# Patient Record
Sex: Female | Born: 1990 | Race: Asian | Hispanic: No | Marital: Married | Smoking: Never smoker
Health system: Southern US, Community
[De-identification: ages and names within clinical notes are randomized; demographics above are authoritative.]

## PROBLEM LIST (undated history)

## (undated) DIAGNOSIS — Z789 Other specified health status: Secondary | ICD-10-CM

## (undated) HISTORY — PX: NO PAST SURGERIES: SHX2092

## (undated) HISTORY — DX: Other specified health status: Z78.9

---

## 2013-02-07 ENCOUNTER — Ambulatory Visit (INDEPENDENT_AMBULATORY_CARE_PROVIDER_SITE_OTHER): Payer: Self-pay | Admitting: *Deleted

## 2013-02-07 DIAGNOSIS — Z3201 Encounter for pregnancy test, result positive: Secondary | ICD-10-CM

## 2013-02-07 LAB — POCT PREGNANCY, URINE: Preg Test, Ur: POSITIVE — AB

## 2013-03-16 ENCOUNTER — Encounter: Payer: Self-pay | Admitting: Family Medicine

## 2013-03-16 ENCOUNTER — Ambulatory Visit (INDEPENDENT_AMBULATORY_CARE_PROVIDER_SITE_OTHER): Payer: Medicaid Other | Admitting: Family Medicine

## 2013-03-16 VITALS — BP 96/61 | Temp 97.6°F | Ht 60.5 in | Wt 108.0 lb

## 2013-03-16 DIAGNOSIS — O0930 Supervision of pregnancy with insufficient antenatal care, unspecified trimester: Secondary | ICD-10-CM

## 2013-03-16 DIAGNOSIS — Z3402 Encounter for supervision of normal first pregnancy, second trimester: Secondary | ICD-10-CM

## 2013-03-16 LAB — POCT URINALYSIS DIP (DEVICE)
Ketones, ur: NEGATIVE mg/dL
Protein, ur: NEGATIVE mg/dL
Specific Gravity, Urine: 1.015 (ref 1.005–1.030)
Urobilinogen, UA: 0.2 mg/dL (ref 0.0–1.0)
pH: 7.5 (ref 5.0–8.0)

## 2013-03-16 MED ORDER — PRENATAL VITAMINS 0.8 MG PO TABS: 1.0000 | ORAL_TABLET | Freq: Every day | ORAL | Status: DC

## 2013-03-16 NOTE — Progress Notes (Signed)
Married to baby's father. Plans to breast and bottle.   Subjective:    Lori Cardenas is being seen today for her first obstetrical visit.  This is not a planned pregnancy. She is at [redacted]w[redacted]d gestation by LMP (pretty sure). Her obstetrical history is significant for nothing. Relationship with FOB: spouse, living together. Patient does intend to breast and bottle feed. Pregnancy history fully reviewed.  Menstrual History: OB History   Grav Para Term Preterm Abortions TAB SAB Ect Mult Living   1               Patient's last menstrual period was 11/09/2012.   The following portions of the patient's history were reviewed and updated as appropriate: allergies, current medications, past family history, past medical history, past social history, past surgical history and problem list.  Review of Systems Constitutional: negative for chills, fevers and night sweats Eyes: negative for visual disturbance Respiratory: positive for cough, negative for asthma, wheezing Cardiovascular: negative for chest pain, dyspnea, irregular heart beat and palpitations Gastrointestinal: negative for abdominal pain, constipation, diarrhea, nausea and vomiting Genitourinary:negative for dysuria and vaginal discharge Neurological: negative for dizziness and headaches    Objective:   Filed Vitals:   03/16/13 1015  BP: 96/61  Temp: 97.6 F (36.4 C)    GEN:  WNWD, no distress HEENT:  NCAT, EOMI, normal conjunctiva NECK:  Supple, no adenopathy, no thyromegaly CV:  RRR, no murmur RESP:  CTAB ABD:  Soft, gravid (FH appropriate for dates), norma BS, no tenderness PELVIC:  Normal external genitalia and vagina. Normal cervix. Minimal discharge. No CMT or adnexal tenderness EXTREM:  No edema or tenderness NEURO:  Alert, oriented, no focal deficits    Assessment:    Pregnancy at 18 and 1/7 weeks    Plan:    Initial labs drawn. Prenatal vitamins. Problem list reviewed and updated. AFP3 discussed: declined. Role  of ultrasound in pregnancy discussed; fetal survey: requested. Amniocentesis discussed: not indicated. Follow up in 4 weeks. 50% of 45 min visit spent on counseling and coordination of care.

## 2013-03-16 NOTE — Patient Instructions (Addendum)
Pregnancy - Second Trimester The second trimester of pregnancy (3 to 6 months) is a period of rapid growth for you and your baby. At the end of the sixth month, your baby is about 9 inches long and weighs 1 1/2 pounds. You will begin to feel the baby move between 18 and 20 weeks of the pregnancy. This is called quickening. Weight gain is faster. A clear fluid (colostrum) may leak out of your breasts. You may feel small contractions of the womb (uterus). This is known as false labor or Braxton-Hicks contractions. This is like a practice for labor when the baby is ready to be born. Usually, the problems with morning sickness have usually passed by the end of your first trimester. Some women develop small dark blotches (called cholasma, mask of pregnancy) on their face that usually goes away after the baby is born. Exposure to the sun makes the blotches worse. Acne may also develop in some pregnant women and pregnant women who have acne, may find that it goes away. PRENATAL EXAMS  Blood work may continue to be done during prenatal exams. These tests are done to check on your health and the probable health of your baby. Blood work is used to follow your blood levels (hemoglobin). Anemia (low hemoglobin) is common during pregnancy. Iron and vitamins are given to help prevent this. You will also be checked for diabetes between 24 and 28 weeks of the pregnancy. Some of the previous blood tests may be repeated.  The size of the uterus is measured during each visit. This is to make sure that the baby is continuing to grow properly according to the dates of the pregnancy.  Your blood pressure is checked every prenatal visit. This is to make sure you are not getting toxemia.  Your urine is checked to make sure you do not have an infection, diabetes or protein in the urine.  Your weight is checked often to make sure gains are happening at the suggested rate. This is to ensure that both you and your baby are growing  normally.  Sometimes, an ultrasound is performed to confirm the proper growth and development of the baby. This is a test which bounces harmless sound waves off the baby so your caregiver can more accurately determine due dates. Sometimes, a specialized test is done on the amniotic fluid surrounding the baby. This test is called an amniocentesis. The amniotic fluid is obtained by sticking a needle into the belly (abdomen). This is done to check the chromosomes in instances where there is a concern about possible genetic problems with the baby. It is also sometimes done near the end of pregnancy if an early delivery is required. In this case, it is done to help make sure the baby's lungs are mature enough for the baby to live outside of the womb. CHANGES OCCURING IN THE SECOND TRIMESTER OF PREGNANCY Your body goes through many changes during pregnancy. They vary from person to person. Talk to your caregiver about changes you notice that you are concerned about.  During the second trimester, you will likely have an increase in your appetite. It is normal to have cravings for certain foods. This varies from person to person and pregnancy to pregnancy.  Your lower abdomen will begin to bulge.  You may have to urinate more often because the uterus and baby are pressing on your bladder. It is also common to get more bladder infections during pregnancy (pain with urination). You can help this by   drinking lots of fluids and emptying your bladder before and after intercourse.  You may begin to get stretch marks on your hips, abdomen, and breasts. These are normal changes in the body during pregnancy. There are no exercises or medications to take that prevent this change.  You may begin to develop swollen and bulging veins (varicose veins) in your legs. Wearing support hose, elevating your feet for 15 minutes, 3 to 4 times a day and limiting salt in your diet helps lessen the problem.  Heartburn may develop  as the uterus grows and pushes up against the stomach. Antacids recommended by your caregiver helps with this problem. Also, eating smaller meals 4 to 5 times a day helps.  Constipation can be treated with a stool softener or adding bulk to your diet. Drinking lots of fluids, vegetables, fruits, and whole grains are helpful.  Exercising is also helpful. If you have been very active up until your pregnancy, most of these activities can be continued during your pregnancy. If you have been less active, it is helpful to start an exercise program such as walking.  Hemorrhoids (varicose veins in the rectum) may develop at the end of the second trimester. Warm sitz baths and hemorrhoid cream recommended by your caregiver helps hemorrhoid problems.  Backaches may develop during this time of your pregnancy. Avoid heavy lifting, wear low heal shoes and practice good posture to help with backache problems.  Some pregnant women develop tingling and numbness of their hand and fingers because of swelling and tightening of ligaments in the wrist (carpel tunnel syndrome). This goes away after the baby is born.  As your breasts enlarge, you may have to get a bigger bra. Get a comfortable, cotton, support bra. Do not get a nursing bra until the last month of the pregnancy if you will be nursing the baby.  You may get a dark line from your belly button to the pubic area called the linea nigra.  You may develop rosy cheeks because of increase blood flow to the face.  You may develop spider looking lines of the face, neck, arms and chest. These go away after the baby is born. HOME CARE INSTRUCTIONS   It is extremely important to avoid all smoking, herbs, alcohol, and unprescribed drugs during your pregnancy. These chemicals affect the formation and growth of the baby. Avoid these chemicals throughout the pregnancy to ensure the delivery of a healthy infant.  Most of your home care instructions are the same as  suggested for the first trimester of your pregnancy. Keep your caregiver's appointments. Follow your caregiver's instructions regarding medication use, exercise and diet.  During pregnancy, you are providing food for you and your baby. Continue to eat regular, well-balanced meals. Choose foods such as meat, fish, milk and other low fat dairy products, vegetables, fruits, and whole-grain breads and cereals. Your caregiver will tell you of the ideal weight gain.  A physical sexual relationship may be continued up until near the end of pregnancy if there are no other problems. Problems could include early (premature) leaking of amniotic fluid from the membranes, vaginal bleeding, abdominal pain, or other medical or pregnancy problems.  Exercise regularly if there are no restrictions. Check with your caregiver if you are unsure of the safety of some of your exercises. The greatest weight gain will occur in the last 2 trimesters of pregnancy. Exercise will help you:  Control your weight.  Get you in shape for labor and delivery.  Lose weight   after you have the baby.  Wear a good support or jogging bra for breast tenderness during pregnancy. This may help if worn during sleep. Pads or tissues may be used in the bra if you are leaking colostrum.  Do not use hot tubs, steam rooms or saunas throughout the pregnancy.  Wear your seat belt at all times when driving. This protects you and your baby if you are in an accident.  Avoid raw meat, uncooked cheese, cat litter boxes and soil used by cats. These carry germs that can cause birth defects in the baby.  The second trimester is also a good time to visit your dentist for your dental health if this has not been done yet. Getting your teeth cleaned is OK. Use a soft toothbrush. Brush gently during pregnancy.  It is easier to loose urine during pregnancy. Tightening up and strengthening the pelvic muscles will help with this problem. Practice stopping your  urination while you are going to the bathroom. These are the same muscles you need to strengthen. It is also the muscles you would use as if you were trying to stop from passing gas. You can practice tightening these muscles up 10 times a set and repeating this about 3 times per day. Once you know what muscles to tighten up, do not perform these exercises during urination. It is more likely to contribute to an infection by backing up the urine.  Ask for help if you have financial, counseling or nutritional needs during pregnancy. Your caregiver will be able to offer counseling for these needs as well as refer you for other special needs.  Your skin may become oily. If so, wash your face with mild soap, use non-greasy moisturizer and oil or cream based makeup. MEDICATIONS AND DRUG USE IN PREGNANCY  Take prenatal vitamins as directed. The vitamin should contain 1 milligram of folic acid. Keep all vitamins out of reach of children. Only a couple vitamins or tablets containing iron may be fatal to a baby or young child when ingested.  Avoid use of all medications, including herbs, over-the-counter medications, not prescribed or suggested by your caregiver. Only take over-the-counter or prescription medicines for pain, discomfort, or fever as directed by your caregiver. Do not use aspirin.  Let your caregiver also know about herbs you may be using.  Alcohol is related to a number of birth defects. This includes fetal alcohol syndrome. All alcohol, in any form, should be avoided completely. Smoking will cause low birth rate and premature babies.  Street or illegal drugs are very harmful to the baby. They are absolutely forbidden. A baby born to an addicted mother will be addicted at birth. The baby will go through the same withdrawal an adult does. SEEK MEDICAL CARE IF:  You have any concerns or worries during your pregnancy. It is better to call with your questions if you feel they cannot wait, rather  than worry about them. SEEK IMMEDIATE MEDICAL CARE IF:   An unexplained oral temperature above 102 F (38.9 C) develops, or as your caregiver suggests.  You have leaking of fluid from the vagina (birth canal). If leaking membranes are suspected, take your temperature and tell your caregiver of this when you call.  There is vaginal spotting, bleeding, or passing clots. Tell your caregiver of the amount and how many pads are used. Light spotting in pregnancy is common, especially following intercourse.  You develop a bad smelling vaginal discharge with a change in the color from clear   to white.  You continue to feel sick to your stomach (nauseated) and have no relief from remedies suggested. You vomit blood or coffee ground-like materials.  You lose more than 2 pounds of weight or gain more than 2 pounds of weight over 1 week, or as suggested by your caregiver.  You notice swelling of your face, hands, feet, or legs.  You get exposed to German measles and have never had them.  You are exposed to fifth disease or chickenpox.  You develop belly (abdominal) pain. Round ligament discomfort is a common non-cancerous (benign) cause of abdominal pain in pregnancy. Your caregiver still must evaluate you.  You develop a bad headache that does not go away.  You develop fever, diarrhea, pain with urination, or shortness of breath.  You develop visual problems, blurry, or double vision.  You fall or are in a car accident or any kind of trauma.  There is mental or physical violence at home. Document Released: 11/25/2001 Document Revised: 02/23/2012 Document Reviewed: 05/30/2009 [employer] Patient Information 2013 [employer].  

## 2013-03-16 NOTE — Progress Notes (Signed)
Pulse 87 C/o of lower back pain and h/a.

## 2013-03-21 ENCOUNTER — Other Ambulatory Visit: Payer: Self-pay | Admitting: Family Medicine

## 2013-03-21 ENCOUNTER — Encounter: Payer: Self-pay | Admitting: Family Medicine

## 2013-03-21 ENCOUNTER — Ambulatory Visit (HOSPITAL_COMMUNITY)
Admission: RE | Admit: 2013-03-21 | Discharge: 2013-03-21 | Disposition: A | Discharge status: Home / Self Care | Payer: Medicaid Other | Source: Ambulatory Visit | Attending: Family Medicine | Admitting: Family Medicine

## 2013-03-21 DIAGNOSIS — Z3402 Encounter for supervision of normal first pregnancy, second trimester: Secondary | ICD-10-CM

## 2013-03-21 DIAGNOSIS — Z3689 Encounter for other specified antenatal screening: Secondary | ICD-10-CM | POA: Insufficient documentation

## 2013-03-21 MED ORDER — FLUCONAZOLE 150 MG PO TABS: 150.0000 mg | ORAL_TABLET | Freq: Once | ORAL | Status: DC

## 2013-03-22 ENCOUNTER — Telehealth: Payer: Self-pay | Admitting: *Deleted

## 2013-03-22 DIAGNOSIS — B379 Candidiasis, unspecified: Secondary | ICD-10-CM

## 2013-03-22 MED ORDER — FLUCONAZOLE 150 MG PO TABS: 150.0000 mg | ORAL_TABLET | Freq: Once | ORAL | Status: DC

## 2013-03-22 NOTE — Telephone Encounter (Signed)
Rx sent to pharmacy   

## 2013-03-22 NOTE — Telephone Encounter (Signed)
Left message for patient to call us back.  

## 2013-03-22 NOTE — Telephone Encounter (Signed)
Message copied by Mannie Stabile on Tue Mar 22, 2013 11:24 AM ------      Message from: FERRY, [place]      Created: Mon Mar 21, 2013  5:01 PM       Needs diflucan for yeast. Dating change to due date 07/12/13. ------

## 2013-03-23 NOTE — Telephone Encounter (Signed)
Called patient and informed her of results and that the medicine is available for her to pick up at [facility] on [address].patient verbalized understanding and had no further questions

## 2013-03-30 ENCOUNTER — Encounter: Payer: Self-pay | Admitting: *Deleted

## 2013-04-13 ENCOUNTER — Ambulatory Visit (INDEPENDENT_AMBULATORY_CARE_PROVIDER_SITE_OTHER): Payer: Medicaid Other | Admitting: Family Medicine

## 2013-04-13 VITALS — BP 113/76 | Temp 96.6°F | Wt 109.3 lb

## 2013-04-13 DIAGNOSIS — Z23 Encounter for immunization: Secondary | ICD-10-CM

## 2013-04-13 DIAGNOSIS — O0930 Supervision of pregnancy with insufficient antenatal care, unspecified trimester: Secondary | ICD-10-CM

## 2013-04-13 LAB — POCT URINALYSIS DIP (DEVICE)
Bilirubin Urine: NEGATIVE
Ketones, ur: NEGATIVE mg/dL
Leukocytes, UA: NEGATIVE
Protein, ur: NEGATIVE mg/dL
Specific Gravity, Urine: 1.02 (ref 1.005–1.030)

## 2013-04-13 LAB — CBC
HCT: 33.6 % — ABNORMAL LOW (ref 36.0–46.0)
Hemoglobin: 11.7 g/dL — ABNORMAL LOW (ref 12.0–15.0)
MCHC: 34.8 g/dL (ref 30.0–36.0)
RBC: 3.89 MIL/uL (ref 3.87–5.11)
WBC: 9.1 10*3/uL (ref 4.0–10.5)

## 2013-04-13 MED ORDER — TETANUS-DIPHTH-ACELL PERTUSSIS 5-2.5-18.5 LF-MCG/0.5 IM SUSP
0.5000 mL | Freq: Once | INTRAMUSCULAR | Status: AC
  Administered 2013-04-13: 0.5 mL via INTRAMUSCULAR

## 2013-04-13 NOTE — Progress Notes (Signed)
No concerns. 1 hour GTT today.

## 2013-04-13 NOTE — Patient Instructions (Addendum)
Pregnancy - Third Trimester  The third trimester of pregnancy (the last 3 months) is a period of the most rapid growth for you and your baby. The baby approaches a length of 20 inches and a weight of 6 to 10 pounds. The baby is adding on fat and getting ready for life outside your body. While inside, babies have periods of sleeping and waking, suck their thumbs, and hiccups. You can often feel small contractions of the uterus. This is false labor. It is also called Braxton-Hicks contractions. This is like a practice for labor. The usual problems in this stage of pregnancy include more difficulty breathing, swelling of the hands and feet from water retention, and having to urinate more often because of the uterus and baby pressing on your bladder.   PRENATAL EXAMS  · Blood work may continue to be done during prenatal exams. These tests are done to check on your health and the probable health of your baby. Blood work is used to follow your blood levels (hemoglobin). Anemia (low hemoglobin) is common during pregnancy. Iron and vitamins are given to help prevent this. You may also continue to be checked for diabetes. Some of the past blood tests may be done again.  · The size of the uterus is measured during each visit. This makes sure your baby is growing properly according to your pregnancy dates.  · Your blood pressure is checked every prenatal visit. This is to make sure you are not getting toxemia.  · Your urine is checked every prenatal visit for infection, diabetes and protein.  · Your weight is checked at each visit. This is done to make sure gains are happening at the suggested rate and that you and your baby are growing normally.  · Sometimes, an ultrasound is performed to confirm the position and the proper growth and development of the baby. This is a test done that bounces harmless sound waves off the baby so your caregiver can more accurately determine due dates.  · Discuss the type of pain medication and  anesthesia you will have during your labor and delivery.  · Discuss the possibility and anesthesia if a Cesarean Section might be necessary.  · Inform your caregiver if there is any mental or physical violence at home.  Sometimes, a specialized non-stress test, contraction stress test and biophysical profile are done to make sure the baby is not having a problem. Checking the amniotic fluid surrounding the baby is called an amniocentesis. The amniotic fluid is removed by sticking a needle into the belly (abdomen). This is sometimes done near the end of pregnancy if an early delivery is required. In this case, it is done to help make sure the baby's lungs are mature enough for the baby to live outside of the womb. If the lungs are not mature and it is unsafe to deliver the baby, an injection of cortisone medication is given to the mother 1 to 2 days before the delivery. This helps the baby's lungs mature and makes it safer to deliver the baby.  CHANGES OCCURING IN THE THIRD TRIMESTER OF PREGNANCY  Your body goes through many changes during pregnancy. They vary from person to person. Talk to your caregiver about changes you notice and are concerned about.  · During the last trimester, you have probably had an increase in your appetite. It is normal to have cravings for certain foods. This varies from person to person and pregnancy to pregnancy.  · You may begin to   get stretch marks on your hips, abdomen, and breasts. These are normal changes in the body during pregnancy. There are no exercises or medications to take which prevent this change.  · Constipation may be treated with a stool softener or adding bulk to your diet. Drinking lots of fluids, fiber in vegetables, fruits, and whole grains are helpful.  · Exercising is also helpful. If you have been very active up until your pregnancy, most of these activities can be continued during your pregnancy. If you have been less active, it is helpful to start an exercise  program such as walking. Consult your caregiver before starting exercise programs.  · Avoid all smoking, alcohol, un-prescribed drugs, herbs and "street drugs" during your pregnancy. These chemicals affect the formation and growth of the baby. Avoid chemicals throughout the pregnancy to ensure the delivery of a healthy infant.  · Backache, varicose veins and hemorrhoids may develop or get worse.  · You will tire more easily in the third trimester, which is normal.  · The baby's movements may be stronger and more often.  · You may become short of breath easily.  · Your belly button may stick out.  · A yellow discharge may leak from your breasts called colostrum.  · You may have a bloody mucus discharge. This usually occurs a few days to a week before labor begins.  HOME CARE INSTRUCTIONS   · Keep your caregiver's appointments. Follow your caregiver's instructions regarding medication use, exercise, and diet.  · During pregnancy, you are providing food for you and your baby. Continue to eat regular, well-balanced meals. Choose foods such as meat, fish, milk and other low fat dairy products, vegetables, fruits, and whole-grain breads and cereals. Your caregiver will tell you of the ideal weight gain.  · A physical sexual relationship may be continued throughout pregnancy if there are no other problems such as early (premature) leaking of amniotic fluid from the membranes, vaginal bleeding, or belly (abdominal) pain.  · Exercise regularly if there are no restrictions. Check with your caregiver if you are unsure of the safety of your exercises. Greater weight gain will occur in the last 2 trimesters of pregnancy. Exercising helps:  · Control your weight.  · Get you in shape for labor and delivery.  · You lose weight after you deliver.  · Rest a lot with legs elevated, or as needed for leg cramps or low back pain.  · Wear a good support or jogging bra for breast tenderness during pregnancy. This may help if worn during  sleep. Pads or tissues may be used in the bra if you are leaking colostrum.  · Do not use hot tubs, steam rooms, or saunas.  · Wear your seat belt when driving. This protects you and your baby if you are in an accident.  · Avoid raw meat, cat litter boxes and soil used by cats. These carry germs that can cause birth defects in the baby.  · It is easier to loose urine during pregnancy. Tightening up and strengthening the pelvic muscles will help with this problem. You can practice stopping your urination while you are going to the bathroom. These are the same muscles you need to strengthen. It is also the muscles you would use if you were trying to stop from passing gas. You can practice tightening these muscles up 10 times a set and repeating this about 3 times per day. Once you know what muscles to tighten up, do not perform these   exercises during urination. It is more likely to cause an infection by backing up the urine.  · Ask for help if you have financial, counseling or nutritional needs during pregnancy. Your caregiver will be able to offer counseling for these needs as well as refer you for other special needs.  · Make a list of emergency phone numbers and have them available.  · Plan on getting help from family or friends when you go home from the hospital.  · Make a trial run to the hospital.  · Take prenatal classes with the father to understand, practice and ask questions about the labor and delivery.  · Prepare the baby's room/nursery.  · Do not travel out of the city unless it is absolutely necessary and with the advice of your caregiver.  · Wear only low or no heal shoes to have better balance and prevent falling.  MEDICATIONS AND DRUG USE IN PREGNANCY  · Take prenatal vitamins as directed. The vitamin should contain 1 milligram of folic acid. Keep all vitamins out of reach of children. Only a couple vitamins or tablets containing iron may be fatal to a baby or young child when ingested.  · Avoid use  of all medications, including herbs, over-the-counter medications, not prescribed or suggested by your caregiver. Only take over-the-counter or prescription medicines for pain, discomfort, or fever as directed by your caregiver. Do not use aspirin, ibuprofen (Motrin®, Advil®, Nuprin®) or naproxen (Aleve®) unless OK'd by your caregiver.  · Let your caregiver also know about herbs you may be using.  · Alcohol is related to a number of birth defects. This includes fetal alcohol syndrome. All alcohol, in any form, should be avoided completely. Smoking will cause low birth rate and premature babies.  · Street/illegal drugs are very harmful to the baby. They are absolutely forbidden. A baby born to an addicted mother will be addicted at birth. The baby will go through the same withdrawal an adult does.  SEEK MEDICAL CARE IF:  You have any concerns or worries during your pregnancy. It is better to call with your questions if you feel they cannot wait, rather than worry about them.  DECISIONS ABOUT CIRCUMCISION  You may or may not know the sex of your baby. If you know your baby is a boy, it may be time to think about circumcision. Circumcision is the removal of the foreskin of the penis. This is the skin that covers the sensitive end of the penis. There is no proven medical need for this. Often this decision is made on what is popular at the time or based upon religious beliefs and social issues. You can discuss these issues with your caregiver or pediatrician.  SEEK IMMEDIATE MEDICAL CARE IF:   · An unexplained oral temperature above 102° F (38.9° C) develops, or as your caregiver suggests.  · You have leaking of fluid from the vagina (birth canal). If leaking membranes are suspected, take your temperature and tell your caregiver of this when you call.  · There is vaginal spotting, bleeding or passing clots. Tell your caregiver of the amount and how many pads are used.  · You develop a bad smelling vaginal discharge with  a change in the color from clear to white.  · You develop vomiting that lasts more than 24 hours.  · You develop chills or fever.  · You develop shortness of breath.  · You develop burning on urination.  · You loose more than 2 pounds of weight   or gain more than 2 pounds of weight or as suggested by your caregiver.  · You notice sudden swelling of your face, hands, and feet or legs.  · You develop belly (abdominal) pain. Round ligament discomfort is a common non-cancerous (benign) cause of abdominal pain in pregnancy. Your caregiver still must evaluate you.  · You develop a severe headache that does not go away.  · You develop visual problems, blurred or double vision.  · If you have not felt your baby move for more than 1 hour. If you think the baby is not moving as much as usual, eat something with sugar in it and lie down on your left side for an hour. The baby should move at least 4 to 5 times per hour. Call right away if your baby moves less than that.  · You fall, are in a car accident or any kind of trauma.  · There is mental or physical violence at home.  Document Released: 11/25/2001 Document Revised: 02/23/2012 Document Reviewed: 05/30/2009  [employer]® Patient Information ©2013 [employer].

## 2013-04-13 NOTE — Progress Notes (Signed)
Pulse- 80 

## 2013-04-13 NOTE — Addendum Note (Signed)
Addended by: Faythe Casa on: 04/13/2013 12:58 PM   Modules accepted: Orders

## 2013-04-14 LAB — RPR

## 2013-04-14 LAB — HIV ANTIBODY (ROUTINE TESTING W REFLEX): HIV: NONREACTIVE

## 2013-04-14 LAB — GLUCOSE TOLERANCE, 1 HOUR (50G) W/O FASTING: Glucose, 1 Hour GTT: 58 mg/dL — ABNORMAL LOW (ref 70–140)

## 2013-04-17 ENCOUNTER — Encounter: Payer: Self-pay | Admitting: Family Medicine

## 2013-04-27 ENCOUNTER — Ambulatory Visit (INDEPENDENT_AMBULATORY_CARE_PROVIDER_SITE_OTHER): Payer: Medicaid Other | Admitting: Obstetrics and Gynecology

## 2013-04-27 VITALS — BP 106/66 | Temp 96.8°F | Wt 112.5 lb

## 2013-04-27 DIAGNOSIS — O0930 Supervision of pregnancy with insufficient antenatal care, unspecified trimester: Secondary | ICD-10-CM

## 2013-04-27 DIAGNOSIS — Z3403 Encounter for supervision of normal first pregnancy, third trimester: Secondary | ICD-10-CM

## 2013-04-27 LAB — POCT URINALYSIS DIP (DEVICE)
Bilirubin Urine: NEGATIVE
Glucose, UA: NEGATIVE mg/dL
Ketones, ur: NEGATIVE mg/dL
Protein, ur: NEGATIVE mg/dL
Specific Gravity, Urine: 1.02 (ref 1.005–1.030)

## 2013-04-27 NOTE — Progress Notes (Signed)
Doing well. Generalized discomfort only at night> reassured. Reviewed normal labs.

## 2013-04-27 NOTE — Progress Notes (Signed)
Pulse- 89 Patient reports pain all over at night when she lays down

## 2013-04-27 NOTE — Patient Instructions (Signed)
Pregnancy - Third Trimester  The third trimester of pregnancy (the last 3 months) is a period of the most rapid growth for you and your baby. The baby approaches a length of 20 inches and a weight of 6 to 10 pounds. The baby is adding on fat and getting ready for life outside your body. While inside, babies have periods of sleeping and waking, suck their thumbs, and hiccups. You can often feel small contractions of the uterus. This is false labor. It is also called Braxton-Hicks contractions. This is like a practice for labor. The usual problems in this stage of pregnancy include more difficulty breathing, swelling of the hands and feet from water retention, and having to urinate more often because of the uterus and baby pressing on your bladder.   PRENATAL EXAMS  · Blood work may continue to be done during prenatal exams. These tests are done to check on your health and the probable health of your baby. Blood work is used to follow your blood levels (hemoglobin). Anemia (low hemoglobin) is common during pregnancy. Iron and vitamins are given to help prevent this. You may also continue to be checked for diabetes. Some of the past blood tests may be done again.  · The size of the uterus is measured during each visit. This makes sure your baby is growing properly according to your pregnancy dates.  · Your blood pressure is checked every prenatal visit. This is to make sure you are not getting toxemia.  · Your urine is checked every prenatal visit for infection, diabetes and protein.  · Your weight is checked at each visit. This is done to make sure gains are happening at the suggested rate and that you and your baby are growing normally.  · Sometimes, an ultrasound is performed to confirm the position and the proper growth and development of the baby. This is a test done that bounces harmless sound waves off the baby so your caregiver can more accurately determine due dates.  · Discuss the type of pain medication and  anesthesia you will have during your labor and delivery.  · Discuss the possibility and anesthesia if a Cesarean Section might be necessary.  · Inform your caregiver if there is any mental or physical violence at home.  Sometimes, a specialized non-stress test, contraction stress test and biophysical profile are done to make sure the baby is not having a problem. Checking the amniotic fluid surrounding the baby is called an amniocentesis. The amniotic fluid is removed by sticking a needle into the belly (abdomen). This is sometimes done near the end of pregnancy if an early delivery is required. In this case, it is done to help make sure the baby's lungs are mature enough for the baby to live outside of the womb. If the lungs are not mature and it is unsafe to deliver the baby, an injection of cortisone medication is given to the mother 1 to 2 days before the delivery. This helps the baby's lungs mature and makes it safer to deliver the baby.  CHANGES OCCURING IN THE THIRD TRIMESTER OF PREGNANCY  Your body goes through many changes during pregnancy. They vary from person to person. Talk to your caregiver about changes you notice and are concerned about.  · During the last trimester, you have probably had an increase in your appetite. It is normal to have cravings for certain foods. This varies from person to person and pregnancy to pregnancy.  · You may begin to   get stretch marks on your hips, abdomen, and breasts. These are normal changes in the body during pregnancy. There are no exercises or medications to take which prevent this change.  · Constipation may be treated with a stool softener or adding bulk to your diet. Drinking lots of fluids, fiber in vegetables, fruits, and whole grains are helpful.  · Exercising is also helpful. If you have been very active up until your pregnancy, most of these activities can be continued during your pregnancy. If you have been less active, it is helpful to start an exercise  program such as walking. Consult your caregiver before starting exercise programs.  · Avoid all smoking, alcohol, un-prescribed drugs, herbs and "street drugs" during your pregnancy. These chemicals affect the formation and growth of the baby. Avoid chemicals throughout the pregnancy to ensure the delivery of a healthy infant.  · Backache, varicose veins and hemorrhoids may develop or get worse.  · You will tire more easily in the third trimester, which is normal.  · The baby's movements may be stronger and more often.  · You may become short of breath easily.  · Your belly button may stick out.  · A yellow discharge may leak from your breasts called colostrum.  · You may have a bloody mucus discharge. This usually occurs a few days to a week before labor begins.  HOME CARE INSTRUCTIONS   · Keep your caregiver's appointments. Follow your caregiver's instructions regarding medication use, exercise, and diet.  · During pregnancy, you are providing food for you and your baby. Continue to eat regular, well-balanced meals. Choose foods such as meat, fish, milk and other low fat dairy products, vegetables, fruits, and whole-grain breads and cereals. Your caregiver will tell you of the ideal weight gain.  · A physical sexual relationship may be continued throughout pregnancy if there are no other problems such as early (premature) leaking of amniotic fluid from the membranes, vaginal bleeding, or belly (abdominal) pain.  · Exercise regularly if there are no restrictions. Check with your caregiver if you are unsure of the safety of your exercises. Greater weight gain will occur in the last 2 trimesters of pregnancy. Exercising helps:  · Control your weight.  · Get you in shape for labor and delivery.  · You lose weight after you deliver.  · Rest a lot with legs elevated, or as needed for leg cramps or low back pain.  · Wear a good support or jogging bra for breast tenderness during pregnancy. This may help if worn during  sleep. Pads or tissues may be used in the bra if you are leaking colostrum.  · Do not use hot tubs, steam rooms, or saunas.  · Wear your seat belt when driving. This protects you and your baby if you are in an accident.  · Avoid raw meat, cat litter boxes and soil used by cats. These carry germs that can cause birth defects in the baby.  · It is easier to loose urine during pregnancy. Tightening up and strengthening the pelvic muscles will help with this problem. You can practice stopping your urination while you are going to the bathroom. These are the same muscles you need to strengthen. It is also the muscles you would use if you were trying to stop from passing gas. You can practice tightening these muscles up 10 times a set and repeating this about 3 times per day. Once you know what muscles to tighten up, do not perform these   exercises during urination. It is more likely to cause an infection by backing up the urine.  · Ask for help if you have financial, counseling or nutritional needs during pregnancy. Your caregiver will be able to offer counseling for these needs as well as refer you for other special needs.  · Make a list of emergency phone numbers and have them available.  · Plan on getting help from family or friends when you go home from the hospital.  · Make a trial run to the hospital.  · Take prenatal classes with the father to understand, practice and ask questions about the labor and delivery.  · Prepare the baby's room/nursery.  · Do not travel out of the city unless it is absolutely necessary and with the advice of your caregiver.  · Wear only low or no heal shoes to have better balance and prevent falling.  MEDICATIONS AND DRUG USE IN PREGNANCY  · Take prenatal vitamins as directed. The vitamin should contain 1 milligram of folic acid. Keep all vitamins out of reach of children. Only a couple vitamins or tablets containing iron may be fatal to a baby or young child when ingested.  · Avoid use  of all medications, including herbs, over-the-counter medications, not prescribed or suggested by your caregiver. Only take over-the-counter or prescription medicines for pain, discomfort, or fever as directed by your caregiver. Do not use aspirin, ibuprofen (Motrin®, Advil®, Nuprin®) or naproxen (Aleve®) unless OK'd by your caregiver.  · Let your caregiver also know about herbs you may be using.  · Alcohol is related to a number of birth defects. This includes fetal alcohol syndrome. All alcohol, in any form, should be avoided completely. Smoking will cause low birth rate and premature babies.  · Street/illegal drugs are very harmful to the baby. They are absolutely forbidden. A baby born to an addicted mother will be addicted at birth. The baby will go through the same withdrawal an adult does.  SEEK MEDICAL CARE IF:  You have any concerns or worries during your pregnancy. It is better to call with your questions if you feel they cannot wait, rather than worry about them.  DECISIONS ABOUT CIRCUMCISION  You may or may not know the sex of your baby. If you know your baby is a boy, it may be time to think about circumcision. Circumcision is the removal of the foreskin of the penis. This is the skin that covers the sensitive end of the penis. There is no proven medical need for this. Often this decision is made on what is popular at the time or based upon religious beliefs and social issues. You can discuss these issues with your caregiver or pediatrician.  SEEK IMMEDIATE MEDICAL CARE IF:   · An unexplained oral temperature above 102° F (38.9° C) develops, or as your caregiver suggests.  · You have leaking of fluid from the vagina (birth canal). If leaking membranes are suspected, take your temperature and tell your caregiver of this when you call.  · There is vaginal spotting, bleeding or passing clots. Tell your caregiver of the amount and how many pads are used.  · You develop a bad smelling vaginal discharge with  a change in the color from clear to white.  · You develop vomiting that lasts more than 24 hours.  · You develop chills or fever.  · You develop shortness of breath.  · You develop burning on urination.  · You loose more than 2 pounds of weight   or gain more than 2 pounds of weight or as suggested by your caregiver.  · You notice sudden swelling of your face, hands, and feet or legs.  · You develop belly (abdominal) pain. Round ligament discomfort is a common non-cancerous (benign) cause of abdominal pain in pregnancy. Your caregiver still must evaluate you.  · You develop a severe headache that does not go away.  · You develop visual problems, blurred or double vision.  · If you have not felt your baby move for more than 1 hour. If you think the baby is not moving as much as usual, eat something with sugar in it and lie down on your left side for an hour. The baby should move at least 4 to 5 times per hour. Call right away if your baby moves less than that.  · You fall, are in a car accident or any kind of trauma.  · There is mental or physical violence at home.  Document Released: 11/25/2001 Document Revised: 02/23/2012 Document Reviewed: 05/30/2009  [employer]® Patient Information ©2013 [employer].

## 2013-05-11 ENCOUNTER — Encounter: Payer: Self-pay | Admitting: Family Medicine

## 2013-05-11 ENCOUNTER — Encounter: Payer: Medicaid Other | Admitting: Obstetrics and Gynecology

## 2013-08-16 ENCOUNTER — Encounter: Payer: Self-pay | Admitting: *Deleted

## 2013-08-17 ENCOUNTER — Encounter: Payer: Self-pay | Admitting: *Deleted

## 2014-01-13 ENCOUNTER — Ambulatory Visit (INDEPENDENT_AMBULATORY_CARE_PROVIDER_SITE_OTHER): Payer: Medicaid Other

## 2014-01-13 DIAGNOSIS — N926 Irregular menstruation, unspecified: Secondary | ICD-10-CM

## 2014-01-13 LAB — POCT PREGNANCY, URINE: Preg Test, Ur: NEGATIVE

## 2014-01-13 NOTE — Progress Notes (Signed)
Pt. Came to clinic today for a pregnancy test. Pregnancy test negative. Pt. Informed. No questions or concerns.

## 2014-01-19 ENCOUNTER — Encounter (HOSPITAL_COMMUNITY): Payer: Self-pay | Admitting: *Deleted

## 2014-06-27 ENCOUNTER — Ambulatory Visit (INDEPENDENT_AMBULATORY_CARE_PROVIDER_SITE_OTHER): Payer: Self-pay

## 2014-06-27 DIAGNOSIS — Z3201 Encounter for pregnancy test, result positive: Secondary | ICD-10-CM

## 2014-06-27 DIAGNOSIS — Z3491 Encounter for supervision of normal pregnancy, unspecified, first trimester: Secondary | ICD-10-CM

## 2014-06-27 DIAGNOSIS — Z3480 Encounter for supervision of other normal pregnancy, unspecified trimester: Secondary | ICD-10-CM

## 2014-06-27 LAB — POCT PREGNANCY, URINE: PREG TEST UR: POSITIVE — AB

## 2014-06-27 NOTE — Progress Notes (Signed)
Pt here for pregnancy test. Resulted positive.  Pt decided to receive prenatal care here at the [facility].  Pt informed me that her mother is a diabetic so early glucola completed today.  Informed pt that another glucola will be completed btwn 26-[redacted] weeks gestation.  Pt stated understanding.  Pt unsure of LMP so US scheduled for July 17th @ 1315.  Initial OB labs completed.

## 2014-06-28 LAB — OBSTETRIC PANEL
ANTIBODY SCREEN: NEGATIVE
BASOS PCT: 0 % (ref 0–1)
Basophils Absolute: 0 10*3/uL (ref 0.0–0.1)
EOS ABS: 0.2 10*3/uL (ref 0.0–0.7)
Eosinophils Relative: 2 % (ref 0–5)
HEMATOCRIT: 36.8 % (ref 36.0–46.0)
HEMOGLOBIN: 13 g/dL (ref 12.0–15.0)
Hepatitis B Surface Ag: NEGATIVE
Lymphocytes Relative: 27 % (ref 12–46)
Lymphs Abs: 2.1 10*3/uL (ref 0.7–4.0)
MCH: 29 pg (ref 26.0–34.0)
MCHC: 35.3 g/dL (ref 30.0–36.0)
MCV: 82 fL (ref 78.0–100.0)
MONO ABS: 0.5 10*3/uL (ref 0.1–1.0)
MONOS PCT: 6 % (ref 3–12)
NEUTROS ABS: 5.1 10*3/uL (ref 1.7–7.7)
Neutrophils Relative %: 65 % (ref 43–77)
Platelets: 219 10*3/uL (ref 150–400)
RBC: 4.49 MIL/uL (ref 3.87–5.11)
RDW: 14.6 % (ref 11.5–15.5)
RH TYPE: POSITIVE
Rubella: 8.72 Index — ABNORMAL HIGH (ref <0.90)
WBC: 7.8 10*3/uL (ref 4.0–10.5)

## 2014-06-28 LAB — GLUCOSE TOLERANCE, 1 HOUR (50G) W/O FASTING: Glucose, 1 Hour GTT: 63 mg/dL — ABNORMAL LOW (ref 70–140)

## 2014-06-28 LAB — HIV ANTIBODY (ROUTINE TESTING W REFLEX): HIV 1&2 Ab, 4th Generation: NONREACTIVE

## 2014-06-29 LAB — PRESCRIPTION MONITORING PROFILE (19 PANEL)
AMPHETAMINE/METH: NEGATIVE ng/mL
BUPRENORPHINE, URINE: NEGATIVE ng/mL
Barbiturate Screen, Urine: NEGATIVE ng/mL
Benzodiazepine Screen, Urine: NEGATIVE ng/mL
CARISOPRODOL, URINE: NEGATIVE ng/mL
CREATININE, URINE: 196.13 mg/dL (ref >20.0)
Cannabinoid Scrn, Ur: NEGATIVE ng/mL
Cocaine Metabolites: NEGATIVE ng/mL
Fentanyl, Ur: NEGATIVE ng/mL
MDMA URINE: NEGATIVE ng/mL
MEPERIDINE UR: NEGATIVE ng/mL
METHADONE SCREEN, URINE: NEGATIVE ng/mL
Methaqualone: NEGATIVE ng/mL
NITRITES URINE, INITIAL: NEGATIVE ug/mL
OXYCODONE SCRN UR: NEGATIVE ng/mL
Opiate Screen, Urine: NEGATIVE ng/mL
PH URINE, INITIAL: 8.5 pH (ref 4.5–8.9)
PROPOXYPHENE: NEGATIVE ng/mL
Phencyclidine, Ur: NEGATIVE ng/mL
TAPENTADOLUR: NEGATIVE ng/mL
TRAMADOL UR: NEGATIVE ng/mL
Zolpidem, Urine: NEGATIVE ng/mL

## 2014-06-29 LAB — HEMOGLOBINOPATHY EVALUATION
HEMOGLOBIN OTHER: 0 %
HGB A: 97 % (ref 96.8–97.8)
Hgb A2 Quant: 3 % (ref 2.2–3.2)
Hgb F Quant: 0 % (ref 0.0–2.0)
Hgb S Quant: 0 %

## 2014-06-29 LAB — CULTURE, OB URINE: Colony Count: 4000

## 2014-06-30 ENCOUNTER — Ambulatory Visit (HOSPITAL_COMMUNITY)
Admission: RE | Admit: 2014-06-30 | Discharge: 2014-06-30 | Disposition: A | Discharge status: Home / Self Care | Payer: Medicaid Other | Source: Ambulatory Visit | Attending: Obstetrics & Gynecology | Admitting: Obstetrics & Gynecology

## 2014-06-30 DIAGNOSIS — Z3689 Encounter for other specified antenatal screening: Secondary | ICD-10-CM | POA: Insufficient documentation

## 2014-06-30 DIAGNOSIS — Z3201 Encounter for pregnancy test, result positive: Secondary | ICD-10-CM

## 2014-07-01 ENCOUNTER — Encounter: Payer: Self-pay | Admitting: Obstetrics & Gynecology

## 2014-08-02 ENCOUNTER — Encounter: Payer: Self-pay | Admitting: Advanced Practice Midwife

## 2014-08-16 ENCOUNTER — Ambulatory Visit (INDEPENDENT_AMBULATORY_CARE_PROVIDER_SITE_OTHER): Payer: Medicaid Other | Admitting: Advanced Practice Midwife

## 2014-08-16 ENCOUNTER — Encounter: Payer: Self-pay | Admitting: Advanced Practice Midwife

## 2014-08-16 VITALS — BP 99/67 | HR 90 | Temp 97.4°F | Wt 110.3 lb

## 2014-08-16 DIAGNOSIS — Z3480 Encounter for supervision of other normal pregnancy, unspecified trimester: Secondary | ICD-10-CM

## 2014-08-16 DIAGNOSIS — Z3492 Encounter for supervision of normal pregnancy, unspecified, second trimester: Secondary | ICD-10-CM

## 2014-08-16 LAB — POCT URINALYSIS DIP (DEVICE)
BILIRUBIN URINE: NEGATIVE
GLUCOSE, UA: NEGATIVE mg/dL
Hgb urine dipstick: NEGATIVE
Ketones, ur: NEGATIVE mg/dL
LEUKOCYTES UA: NEGATIVE
NITRITE: NEGATIVE
Protein, ur: NEGATIVE mg/dL
Specific Gravity, Urine: 1.015 (ref 1.005–1.030)
UROBILINOGEN UA: 0.2 mg/dL (ref 0.0–1.0)
pH: 6.5 (ref 5.0–8.0)

## 2014-08-16 MED ORDER — PRENATAL VITAMINS 0.8 MG PO TABS: 1.0000 | ORAL_TABLET | Freq: Every day | ORAL | Status: DC

## 2014-08-16 NOTE — Patient Instructions (Signed)
Second Trimester of Pregnancy The second trimester is from week 13 through week 28, months 4 through 6. The second trimester is often a time when you feel your best. Your body has also adjusted to being pregnant, and you begin to feel better physically. Usually, morning sickness has lessened or quit completely, you may have more energy, and you may have an increase in appetite. The second trimester is also a time when the fetus is growing rapidly. At the end of the sixth month, the fetus is about 9 inches long and weighs about 1 pounds. You will likely begin to feel the baby move (quickening) between 18 and 20 weeks of the pregnancy. BODY CHANGES Your body goes through many changes during pregnancy. The changes vary from woman to woman.   Your weight will continue to increase. You will notice your lower abdomen bulging out.  You may begin to get stretch marks on your hips, abdomen, and breasts.  You may develop headaches that can be relieved by medicines approved by your health care provider.  You may urinate more often because the fetus is pressing on your bladder.  You may develop or continue to have heartburn as a result of your pregnancy.  You may develop constipation because certain hormones are causing the muscles that push waste through your intestines to slow down.  You may develop hemorrhoids or swollen, bulging veins (varicose veins).  You may have back pain because of the weight gain and pregnancy hormones relaxing your joints between the bones in your pelvis and as a result of a shift in weight and the muscles that support your balance.  Your breasts will continue to grow and be tender.  Your gums may bleed and may be sensitive to brushing and flossing.  Dark spots or blotches (chloasma, mask of pregnancy) may develop on your face. This will likely fade after the baby is born.  A dark line from your belly button to the pubic area (linea nigra) may appear. This will likely  fade after the baby is born.  You may have changes in your hair. These can include thickening of your hair, rapid growth, and changes in texture. Some women also have hair loss during or after pregnancy, or hair that feels dry or thin. Your hair will most likely return to normal after your baby is born. WHAT TO EXPECT AT YOUR PRENATAL VISITS During a routine prenatal visit:  You will be weighed to make sure you and the fetus are growing normally.  Your blood pressure will be taken.  Your abdomen will be measured to track your baby's growth.  The fetal heartbeat will be listened to.  Any test results from the previous visit will be discussed. Your health care provider may ask you:  How you are feeling.  If you are feeling the baby move.  If you have had any abnormal symptoms, such as leaking fluid, bleeding, severe headaches, or abdominal cramping.  If you have any questions. Other tests that may be performed during your second trimester include:  Blood tests that check for:  Low iron levels (anemia).  Gestational diabetes (between 24 and 28 weeks).  Rh antibodies.  Urine tests to check for infections, diabetes, or protein in the urine.  An ultrasound to confirm the proper growth and development of the baby.  An amniocentesis to check for possible genetic problems.  Fetal screens for spina bifida and Down syndrome. HOME CARE INSTRUCTIONS   Avoid all smoking, herbs, alcohol, and unprescribed   drugs. These chemicals affect the formation and growth of the baby.  Follow your health care provider's instructions regarding medicine use. There are medicines that are either safe or unsafe to take during pregnancy.  Exercise only as directed by your health care provider. Experiencing uterine cramps is a good sign to stop exercising.  Continue to eat regular, healthy meals.  Wear a good support bra for breast tenderness.  Do not use hot tubs, steam rooms, or saunas.  Wear  your seat belt at all times when driving.  Avoid raw meat, uncooked cheese, cat litter boxes, and soil used by cats. These carry germs that can cause birth defects in the baby.  Take your prenatal vitamins.  Try taking a stool softener (if your health care provider approves) if you develop constipation. Eat more high-fiber foods, such as fresh vegetables or fruit and whole grains. Drink plenty of fluids to keep your urine clear or pale yellow.  Take warm sitz baths to soothe any pain or discomfort caused by hemorrhoids. Use hemorrhoid cream if your health care provider approves.  If you develop varicose veins, wear support hose. Elevate your feet for 15 minutes, 3-4 times a day. Limit salt in your diet.  Avoid heavy lifting, wear low heel shoes, and practice good posture.  Rest with your legs elevated if you have leg cramps or low back pain.  Visit your dentist if you have not gone yet during your pregnancy. Use a soft toothbrush to brush your teeth and be gentle when you floss.  A sexual relationship may be continued unless your health care provider directs you otherwise.  Continue to go to all your prenatal visits as directed by your health care provider. SEEK MEDICAL CARE IF:   You have dizziness.  You have mild pelvic cramps, pelvic pressure, or nagging pain in the abdominal area.  You have persistent nausea, vomiting, or diarrhea.  You have a bad smelling vaginal discharge.  You have pain with urination. SEEK IMMEDIATE MEDICAL CARE IF:   You have a fever.  You are leaking fluid from your vagina.  You have spotting or bleeding from your vagina.  You have severe abdominal cramping or pain.  You have rapid weight gain or loss.  You have shortness of breath with chest pain.  You notice sudden or extreme swelling of your face, hands, ankles, feet, or legs.  You have not felt your baby move in over an hour.  You have severe headaches that do not go away with  medicine.  You have vision changes. Document Released: 11/25/2001 Document Revised: 12/06/2013 Document Reviewed: 02/01/2013 [employer] Patient Information 2015 [employer]. This information is not intended to replace advice given to you by your health care provider. Make sure you discuss any questions you have with your health care provider.  Breastfeeding Deciding to breastfeed is one of the best choices you can make for you and your baby. A change in hormones during pregnancy causes your breast tissue to grow and increases the number and size of your milk ducts. These hormones also allow proteins, sugars, and fats from your blood supply to make breast milk in your milk-producing glands. Hormones prevent breast milk from being released before your baby is born as well as prompt milk flow after birth. Once breastfeeding has begun, thoughts of your baby, as well as his or her sucking or crying, can stimulate the release of milk from your milk-producing glands.  BENEFITS OF BREASTFEEDING For Your Baby  Your first   milk (colostrum) helps your baby's digestive system function better.   There are antibodies in your milk that help your baby fight off infections.   Your baby has a lower incidence of asthma, allergies, and sudden infant death syndrome.   The nutrients in breast milk are better for your baby than infant formulas and are designed uniquely for your baby's needs.   Breast milk improves your baby's brain development.   Your baby is less likely to develop other conditions, such as childhood obesity, asthma, or type 2 diabetes mellitus.  For You   Breastfeeding helps to create a very special bond between you and your baby.   Breastfeeding is convenient. Breast milk is always available at the correct temperature and costs nothing.   Breastfeeding helps to burn calories and helps you lose the weight gained during pregnancy.   Breastfeeding makes your uterus contract to its  prepregnancy size faster and slows bleeding (lochia) after you give birth.   Breastfeeding helps to lower your risk of developing type 2 diabetes mellitus, osteoporosis, and breast or ovarian cancer later in life. SIGNS THAT YOUR BABY IS HUNGRY Early Signs of Hunger  Increased alertness or activity.  Stretching.  Movement of the head from side to side.  Movement of the head and opening of the mouth when the corner of the mouth or cheek is stroked (rooting).  Increased sucking sounds, smacking lips, cooing, sighing, or squeaking.  Hand-to-mouth movements.  Increased sucking of fingers or hands. Late Signs of Hunger  Fussing.  Intermittent crying. Extreme Signs of Hunger Signs of extreme hunger will require calming and consoling before your baby will be able to breastfeed successfully. Do not wait for the following signs of extreme hunger to occur before you initiate breastfeeding:   Restlessness.  A loud, strong cry.   Screaming. BREASTFEEDING BASICS Breastfeeding Initiation  Find a comfortable place to sit or lie down, with your neck and back well supported.  Place a pillow or rolled up blanket under your baby to bring him or her to the level of your breast (if you are seated). Nursing pillows are specially designed to help support your arms and your baby while you breastfeed.  Make sure that your baby's abdomen is facing your abdomen.   Gently massage your breast. With your fingertips, massage from your chest wall toward your nipple in a circular motion. This encourages milk flow. You may need to continue this action during the feeding if your milk flows slowly.  Support your breast with 4 fingers underneath and your thumb above your nipple. Make sure your fingers are well away from your nipple and your baby's mouth.   Stroke your baby's lips gently with your finger or nipple.   When your baby's mouth is open wide enough, quickly bring your baby to your breast,  placing your entire nipple and as much of the colored area around your nipple (areola) as possible into your baby's mouth.   More areola should be visible above your baby's upper lip than below the lower lip.   Your baby's tongue should be between his or her lower gum and your breast.   Ensure that your baby's mouth is correctly positioned around your nipple (latched). Your baby's lips should create a seal on your breast and be turned out (everted).  It is common for your baby to suck about 2-3 minutes in order to start the flow of breast milk. Latching Teaching your baby how to latch on to your breast   properly is very important. An improper latch can cause nipple pain and decreased milk supply for you and poor weight gain in your baby. Also, if your baby is not latched onto your nipple properly, he or she may swallow some air during feeding. This can make your baby fussy. Burping your baby when you switch breasts during the feeding can help to get rid of the air. However, teaching your baby to latch on properly is still the best way to prevent fussiness from swallowing air while breastfeeding. Signs that your baby has successfully latched on to your nipple:    Silent tugging or silent sucking, without causing you pain.   Swallowing heard between every 3-4 sucks.    Muscle movement above and in front of his or her ears while sucking.  Signs that your baby has not successfully latched on to nipple:   Sucking sounds or smacking sounds from your baby while breastfeeding.  Nipple pain. If you think your baby has not latched on correctly, slip your finger into the corner of your baby's mouth to break the suction and place it between your baby's gums. Attempt breastfeeding initiation again. Signs of Successful Breastfeeding Signs from your baby:   A gradual decrease in the number of sucks or complete cessation of sucking.   Falling asleep.   Relaxation of his or her body.    Retention of a small amount of milk in his or her mouth.   Letting go of your breast by himself or herself. Signs from you:  Breasts that have increased in firmness, weight, and size 1-3 hours after feeding.   Breasts that are softer immediately after breastfeeding.  Increased milk volume, as well as a change in milk consistency and color by the fifth day of breastfeeding.   Nipples that are not sore, cracked, or bleeding. Signs That Your Baby is Getting Enough Milk  Wetting at least 3 diapers in a 24-hour period. The urine should be clear and pale yellow by age 5 days.  At least 3 stools in a 24-hour period by age 5 days. The stool should be soft and yellow.  At least 3 stools in a 24-hour period by age 7 days. The stool should be seedy and yellow.  No loss of weight greater than 10% of birth weight during the first 3 days of age.  Average weight gain of 4-7 ounces (113-198 g) per week after age 4 days.  Consistent daily weight gain by age 5 days, without weight loss after the age of 2 weeks. After a feeding, your baby may spit up a small amount. This is common. BREASTFEEDING FREQUENCY AND DURATION Frequent feeding will help you make more milk and can prevent sore nipples and breast engorgement. Breastfeed when you feel the need to reduce the fullness of your breasts or when your baby shows signs of hunger. This is called "breastfeeding on demand." Avoid introducing a pacifier to your baby while you are working to establish breastfeeding (the first 4-6 weeks after your baby is born). After this time you may choose to use a pacifier. Research has shown that pacifier use during the first year of a baby's life decreases the risk of sudden infant death syndrome (SIDS). Allow your baby to feed on each breast as long as he or she wants. Breastfeed until your baby is finished feeding. When your baby unlatches or falls asleep while feeding from the first breast, offer the second breast.  Because newborns are often sleepy in the   first few weeks of life, you may need to awaken your baby to get him or her to feed. Breastfeeding times will vary from baby to baby. However, the following rules can serve as a guide to help you ensure that your baby is properly fed:  Newborns (babies 4 weeks of age or younger) may breastfeed every 1-3 hours.  Newborns should not go longer than 3 hours during the day or 5 hours during the night without breastfeeding.  You should breastfeed your baby a minimum of 8 times in a 24-hour period until you begin to introduce solid foods to your baby at around 6 months of age. BREAST MILK PUMPING Pumping and storing breast milk allows you to ensure that your baby is exclusively fed your breast milk, even at times when you are unable to breastfeed. This is especially important if you are going back to work while you are still breastfeeding or when you are not able to be present during feedings. Your lactation consultant can give you guidelines on how long it is safe to store breast milk.  A breast pump is a machine that allows you to pump milk from your breast into a sterile bottle. The pumped breast milk can then be stored in a refrigerator or freezer. Some breast pumps are operated by hand, while others use electricity. Ask your lactation consultant which type will work best for you. Breast pumps can be purchased, but some hospitals and breastfeeding support groups lease breast pumps on a monthly basis. A lactation consultant can teach you how to hand express breast milk, if you prefer not to use a pump.  CARING FOR YOUR BREASTS WHILE YOU BREASTFEED Nipples can become dry, cracked, and sore while breastfeeding. The following recommendations can help keep your breasts moisturized and healthy:  Avoid using soap on your nipples.   Wear a supportive bra. Although not required, special nursing bras and tank tops are designed to allow access to your breasts for  breastfeeding without taking off your entire bra or top. Avoid wearing underwire-style bras or extremely tight bras.  Air dry your nipples for 3-4minutes after each feeding.   Use only cotton bra pads to absorb leaked breast milk. Leaking of breast milk between feedings is normal.   Use lanolin on your nipples after breastfeeding. Lanolin helps to maintain your skin's normal moisture barrier. If you use pure lanolin, you do not need to wash it off before feeding your baby again. Pure lanolin is not toxic to your baby. You may also hand express a few drops of breast milk and gently massage that milk into your nipples and allow the milk to air dry. In the first few weeks after giving birth, some women experience extremely full breasts (engorgement). Engorgement can make your breasts feel heavy, warm, and tender to the touch. Engorgement peaks within 3-5 days after you give birth. The following recommendations can help ease engorgement:  Completely empty your breasts while breastfeeding or pumping. You may want to start by applying warm, moist heat (in the shower or with warm water-soaked hand towels) just before feeding or pumping. This increases circulation and helps the milk flow. If your baby does not completely empty your breasts while breastfeeding, pump any extra milk after he or she is finished.  Wear a snug bra (nursing or regular) or tank top for 1-2 days to signal your body to slightly decrease milk production.  Apply ice packs to your breasts, unless this is too uncomfortable for you.    Make sure that your baby is latched on and positioned properly while breastfeeding. If engorgement persists after 48 hours of following these recommendations, contact your health care provider or a lactation consultant. OVERALL HEALTH CARE RECOMMENDATIONS WHILE BREASTFEEDING  Eat healthy foods. Alternate between meals and snacks, eating 3 of each per day. Because what you eat affects your breast milk,  some of the foods may make your baby more irritable than usual. Avoid eating these foods if you are sure that they are negatively affecting your baby.  Drink milk, fruit juice, and water to satisfy your thirst (about 10 glasses a day).   Rest often, relax, and continue to take your prenatal vitamins to prevent fatigue, stress, and anemia.  Continue breast self-awareness checks.  Avoid chewing and smoking tobacco.  Avoid alcohol and drug use. Some medicines that may be harmful to your baby can pass through breast milk. It is important to ask your health care provider before taking any medicine, including all over-the-counter and prescription medicine as well as vitamin and herbal supplements. It is possible to become pregnant while breastfeeding. If birth control is desired, ask your health care provider about options that will be safe for your baby. SEEK MEDICAL CARE IF:   You feel like you want to stop breastfeeding or have become frustrated with breastfeeding.  You have painful breasts or nipples.  Your nipples are cracked or bleeding.  Your breasts are red, tender, or warm.  You have a swollen area on either breast.  You have a fever or chills.  You have nausea or vomiting.  You have drainage other than breast milk from your nipples.  Your breasts do not become full before feedings by the fifth day after you give birth.  You feel sad and depressed.  Your baby is too sleepy to eat well.  Your baby is having trouble sleeping.   Your baby is wetting less than 3 diapers in a 24-hour period.  Your baby has less than 3 stools in a 24-hour period.  Your baby's skin or the white part of his or her eyes becomes yellow.   Your baby is not gaining weight by 5 days of age. SEEK IMMEDIATE MEDICAL CARE IF:   Your baby is overly tired (lethargic) and does not want to wake up and feed.  Your baby develops an unexplained fever. Document Released: 12/01/2005 Document Revised:  12/06/2013 Document Reviewed: 05/25/2013 [employer] Patient Information 2015 [employer]. This information is not intended to replace advice given to you by your health care provider. Make sure you discuss any questions you have with your health care provider.  

## 2014-08-17 LAB — AFP, QUAD SCREEN
AFP: 96.3 ng/mL
CURR GEST AGE: 20 wks.days
HCG, Total: 37.51 IU/mL
INH: 276.6 pg/mL
Interpretation-AFP: NEGATIVE
MOM FOR INH: 1.35
MoM for AFP: 1.17
MoM for hCG: 1.15
OPEN SPINA BIFIDA: NEGATIVE
Osb Risk: 1:7590 {titer}
TRI 18 SCR RISK EST: NEGATIVE
UE3 MOM: 1.06
uE3 Value: 2.73 ng/mL

## 2014-08-23 NOTE — Progress Notes (Signed)
   Subjective:    Lori Cardenas is a G2P1001 [redacted]w[redacted]d by 13 week [place] being seen today for her first obstetrical visit.  Her obstetrical history is significant for close pregnancy spacing. Patient does intend to breast feed. Pregnancy history fully reviewed.  Patient reports nausea, no bleeding and no cramping.  Filed Vitals:   08/16/14 0918  BP: 99/67  Pulse: 90  Temp: 97.4 F (36.3 C)  Weight: 110 lb 4.8 oz (50.032 kg)    HISTORY: OB History  Gravida Para Term Preterm AB SAB TAB Ectopic Multiple Living  # Outcome Date GA Lbr Len/2nd Weight Sex Delivery Anes PTL Lv  2 CUR           1 TRM 2014 [redacted]w[redacted]d   F SVD None N Y     Comments: System Generated. Please review and update pregnancy details.     Past Medical History  Diagnosis Date  . Medical history non-contributory    Past Surgical History  Procedure Laterality Date  . No past surgeries     Family History  Problem Relation Age of Onset  . Diabetes Mother   . Hypertension Mother      Exam    Uterus:   22 cm fundal height  Pelvic Exam: Declined  System: Breast:  normal appearance, no masses or tenderness   Skin: normal coloration and turgor, no rashes    Neurologic: oriented, normal, normal mood, grossly non-focal   Extremities: normal strength, tone, and muscle mass   HEENT sclera clear, anicteric   Mouth/Teeth mucous membranes moist, pharynx normal without lesions and dental hygiene good   Neck no masses   Cardiovascular: regular rate and rhythm, no murmurs or gallops   Respiratory:  appears well, vitals normal, no respiratory distress, acyanotic, normal RR, neck free of mass or lymphadenopathy, chest clear, no wheezing, crepitations, rhonchi, normal symmetric air entry   Abdomen: soft, non-tender; bowel sounds normal; no masses,  no organomegaly   Urinary: Not examined      Assessment:    Pregnancy: G2P1001 Patient Active Problem List   Diagnosis Date Noted  . Supervision of normal  pregnancy in second trimester 08/16/2014        Plan:     Initial labs drawn. Prenatal vitamins. Problem list reviewed and updated. Genetic Screening discussed Quad Screen: ordered.  Ultrasound discussed; fetal survey: ordered.  Follow up in 4 weeks. 75% of 30 min visit spent on counseling and coordination of care.  PNVs  Dorathy Kinsman 08/23/2014

## 2014-08-24 ENCOUNTER — Encounter: Payer: Self-pay | Admitting: Advanced Practice Midwife

## 2014-08-25 ENCOUNTER — Encounter: Payer: Self-pay | Admitting: Advanced Practice Midwife

## 2014-08-25 ENCOUNTER — Ambulatory Visit (HOSPITAL_COMMUNITY)
Admission: RE | Admit: 2014-08-25 | Discharge: 2014-08-25 | Disposition: A | Discharge status: Home / Self Care | Payer: Medicaid Other | Source: Ambulatory Visit | Attending: Advanced Practice Midwife | Admitting: Advanced Practice Midwife

## 2014-08-25 DIAGNOSIS — Z363 Encounter for antenatal screening for malformations: Secondary | ICD-10-CM | POA: Insufficient documentation

## 2014-08-25 DIAGNOSIS — Z1389 Encounter for screening for other disorder: Secondary | ICD-10-CM | POA: Diagnosis present

## 2014-08-25 DIAGNOSIS — Z3492 Encounter for supervision of normal pregnancy, unspecified, second trimester: Secondary | ICD-10-CM

## 2014-08-25 DIAGNOSIS — Z3689 Encounter for other specified antenatal screening: Secondary | ICD-10-CM | POA: Diagnosis not present

## 2014-09-13 ENCOUNTER — Ambulatory Visit (INDEPENDENT_AMBULATORY_CARE_PROVIDER_SITE_OTHER): Payer: Medicaid Other | Admitting: Physician Assistant

## 2014-09-13 VITALS — BP 118/61 | HR 73 | Temp 97.6°F | Wt 116.7 lb

## 2014-09-13 DIAGNOSIS — Z23 Encounter for immunization: Secondary | ICD-10-CM | POA: Diagnosis not present

## 2014-09-13 DIAGNOSIS — Z3480 Encounter for supervision of other normal pregnancy, unspecified trimester: Secondary | ICD-10-CM

## 2014-09-13 DIAGNOSIS — Z3492 Encounter for supervision of normal pregnancy, unspecified, second trimester: Secondary | ICD-10-CM

## 2014-09-13 LAB — POCT URINALYSIS DIP (DEVICE)
Bilirubin Urine: NEGATIVE
GLUCOSE, UA: NEGATIVE mg/dL
Hgb urine dipstick: NEGATIVE
KETONES UR: NEGATIVE mg/dL
LEUKOCYTES UA: NEGATIVE
Nitrite: NEGATIVE
PROTEIN: NEGATIVE mg/dL
Specific Gravity, Urine: 1.015 (ref 1.005–1.030)
Urobilinogen, UA: 0.2 mg/dL (ref 0.0–1.0)
pH: 7.5 (ref 5.0–8.0)

## 2014-09-13 NOTE — Progress Notes (Signed)
24 weeks.  No complaints.  No pain, bleeding, LOF, dysuria. Flu vaccine today RTC 4 weeks.

## 2014-09-13 NOTE — Patient Instructions (Signed)
Second Trimester of Pregnancy The second trimester is from week 13 through week 28, months 4 through 6. The second trimester is often a time when you feel your best. Your body has also adjusted to being pregnant, and you begin to feel better physically. Usually, morning sickness has lessened or quit completely, you may have more energy, and you may have an increase in appetite. The second trimester is also a time when the fetus is growing rapidly. At the end of the sixth month, the fetus is about 9 inches long and weighs about 1 pounds. You will likely begin to feel the baby move (quickening) between 18 and 20 weeks of the pregnancy. BODY CHANGES Your body goes through many changes during pregnancy. The changes vary from woman to woman.   Your weight will continue to increase. You will notice your lower abdomen bulging out.  You may begin to get stretch marks on your hips, abdomen, and breasts.  You may develop headaches that can be relieved by medicines approved by your health care provider.  You may urinate more often because the fetus is pressing on your bladder.  You may develop or continue to have heartburn as a result of your pregnancy.  You may develop constipation because certain hormones are causing the muscles that push waste through your intestines to slow down.  You may develop hemorrhoids or swollen, bulging veins (varicose veins).  You may have back pain because of the weight gain and pregnancy hormones relaxing your joints between the bones in your pelvis and as a result of a shift in weight and the muscles that support your balance.  Your breasts will continue to grow and be tender.  Your gums may bleed and may be sensitive to brushing and flossing.  Dark spots or blotches (chloasma, mask of pregnancy) may develop on your face. This will likely fade after the baby is born.  A dark line from your belly button to the pubic area (linea nigra) may appear. This will likely fade  after the baby is born.  You may have changes in your hair. These can include thickening of your hair, rapid growth, and changes in texture. Some women also have hair loss during or after pregnancy, or hair that feels dry or thin. Your hair will most likely return to normal after your baby is born. WHAT TO EXPECT AT YOUR PRENATAL VISITS During a routine prenatal visit:  You will be weighed to make sure you and the fetus are growing normally.  Your blood pressure will be taken.  Your abdomen will be measured to track your baby's growth.  The fetal heartbeat will be listened to.  Any test results from the previous visit will be discussed. Your health care provider may ask you:  How you are feeling.  If you are feeling the baby move.  If you have had any abnormal symptoms, such as leaking fluid, bleeding, severe headaches, or abdominal cramping.  If you have any questions. Other tests that may be performed during your second trimester include:  Blood tests that check for:  Low iron levels (anemia).  Gestational diabetes (between 24 and 28 weeks).  Rh antibodies.  Urine tests to check for infections, diabetes, or protein in the urine.  An ultrasound to confirm the proper growth and development of the baby.  An amniocentesis to check for possible genetic problems.  Fetal screens for spina bifida and Down syndrome. HOME CARE INSTRUCTIONS   Avoid all smoking, herbs, alcohol, and unprescribed   drugs. These chemicals affect the formation and growth of the baby.  Follow your health care provider's instructions regarding medicine use. There are medicines that are either safe or unsafe to take during pregnancy.  Exercise only as directed by your health care provider. Experiencing uterine cramps is a good sign to stop exercising.  Continue to eat regular, healthy meals.  Wear a good support bra for breast tenderness.  Do not use hot tubs, steam rooms, or saunas.  Wear your  seat belt at all times when driving.  Avoid raw meat, uncooked cheese, cat litter boxes, and soil used by cats. These carry germs that can cause birth defects in the baby.  Take your prenatal vitamins.  Try taking a stool softener (if your health care provider approves) if you develop constipation. Eat more high-fiber foods, such as fresh vegetables or fruit and whole grains. Drink plenty of fluids to keep your urine clear or pale yellow.  Take warm sitz baths to soothe any pain or discomfort caused by hemorrhoids. Use hemorrhoid cream if your health care provider approves.  If you develop varicose veins, wear support hose. Elevate your feet for 15 minutes, 3-4 times a day. Limit salt in your diet.  Avoid heavy lifting, wear low heel shoes, and practice good posture.  Rest with your legs elevated if you have leg cramps or low back pain.  Visit your dentist if you have not gone yet during your pregnancy. Use a soft toothbrush to brush your teeth and be gentle when you floss.  A sexual relationship may be continued unless your health care provider directs you otherwise.  Continue to go to all your prenatal visits as directed by your health care provider. SEEK MEDICAL CARE IF:   You have dizziness.  You have mild pelvic cramps, pelvic pressure, or nagging pain in the abdominal area.  You have persistent nausea, vomiting, or diarrhea.  You have a bad smelling vaginal discharge.  You have pain with urination. SEEK IMMEDIATE MEDICAL CARE IF:   You have a fever.  You are leaking fluid from your vagina.  You have spotting or bleeding from your vagina.  You have severe abdominal cramping or pain.  You have rapid weight gain or loss.  You have shortness of breath with chest pain.  You notice sudden or extreme swelling of your face, hands, ankles, feet, or legs.  You have not felt your baby move in over an hour.  You have severe headaches that do not go away with  medicine.  You have vision changes. Document Released: 11/25/2001 Document Revised: 12/06/2013 Document Reviewed: 02/01/2013 [employer] Patient Information 2015 [employer]. This information is not intended to replace advice given to you by your health care provider. Make sure you discuss any questions you have with your health care provider.  

## 2014-10-11 ENCOUNTER — Encounter: Payer: Self-pay | Admitting: Obstetrics and Gynecology

## 2014-10-11 ENCOUNTER — Ambulatory Visit (INDEPENDENT_AMBULATORY_CARE_PROVIDER_SITE_OTHER): Payer: Medicaid Other | Admitting: Obstetrics and Gynecology

## 2014-10-11 VITALS — BP 118/68 | HR 81 | Temp 98.1°F | Wt 116.6 lb

## 2014-10-11 DIAGNOSIS — Z3492 Encounter for supervision of normal pregnancy, unspecified, second trimester: Secondary | ICD-10-CM

## 2014-10-11 DIAGNOSIS — Z3482 Encounter for supervision of other normal pregnancy, second trimester: Secondary | ICD-10-CM

## 2014-10-11 DIAGNOSIS — Z23 Encounter for immunization: Secondary | ICD-10-CM

## 2014-10-11 LAB — CBC
HCT: 37.1 % (ref 36.0–46.0)
Hemoglobin: 12.5 g/dL (ref 12.0–15.0)
MCH: 30.4 pg (ref 26.0–34.0)
MCHC: 33.7 g/dL (ref 30.0–36.0)
MCV: 90.3 fL (ref 78.0–100.0)
PLATELETS: 202 10*3/uL (ref 150–400)
RBC: 4.11 MIL/uL (ref 3.87–5.11)
RDW: 13.8 % (ref 11.5–15.5)
WBC: 9.3 10*3/uL (ref 4.0–10.5)

## 2014-10-11 LAB — POCT URINALYSIS DIP (DEVICE)
Bilirubin Urine: NEGATIVE
Glucose, UA: NEGATIVE mg/dL
Hgb urine dipstick: NEGATIVE
KETONES UR: NEGATIVE mg/dL
LEUKOCYTES UA: NEGATIVE
Nitrite: NEGATIVE
PROTEIN: NEGATIVE mg/dL
Specific Gravity, Urine: 1.015 (ref 1.005–1.030)
UROBILINOGEN UA: 0.2 mg/dL (ref 0.0–1.0)
pH: 7.5 (ref 5.0–8.0)

## 2014-10-11 LAB — HIV ANTIBODY (ROUTINE TESTING W REFLEX): HIV 1&2 Ab, 4th Generation: NONREACTIVE

## 2014-10-11 LAB — RPR

## 2014-10-11 MED ORDER — TETANUS-DIPHTH-ACELL PERTUSSIS 5-2.5-18.5 LF-MCG/0.5 IM SUSP: 0.5000 mL | Freq: Once | INTRAMUSCULAR | Status: DC

## 2014-10-11 NOTE — Progress Notes (Signed)
Reports intermittent abdominal pain.  1hr gtt and labs today. Tdap today.

## 2014-10-11 NOTE — Progress Notes (Signed)
Doing well. F/U anatomy US scheduled. Plans breast and bottle and Nexplanon.

## 2014-10-11 NOTE — Patient Instructions (Addendum)
Third Trimester of Pregnancy The third trimester is from week 29 through week 42, months 7 through 9. The third trimester is a time when the fetus is growing rapidly. At the end of the ninth month, the fetus is about 20 inches in length and weighs 6-10 pounds.  BODY CHANGES Your body goes through many changes during pregnancy. The changes vary from woman to woman.   Your weight will continue to increase. You can expect to gain 25-35 pounds (11-16 kg) by the end of the pregnancy.  You may begin to get stretch marks on your hips, abdomen, and breasts.  You may urinate more often because the fetus is moving lower into your pelvis and pressing on your bladder.  You may develop or continue to have heartburn as a result of your pregnancy.  You may develop constipation because certain hormones are causing the muscles that push waste through your intestines to slow down.  You may develop hemorrhoids or swollen, bulging veins (varicose veins).  You may have pelvic pain because of the weight gain and pregnancy hormones relaxing your joints between the bones in your pelvis. Backaches may result from overexertion of the muscles supporting your posture.  You may have changes in your hair. These can include thickening of your hair, rapid growth, and changes in texture. Some women also have hair loss during or after pregnancy, or hair that feels dry or thin. Your hair will most likely return to normal after your baby is born.  Your breasts will continue to grow and be tender. A yellow discharge may leak from your breasts called colostrum.  Your belly button may stick out.  You may feel short of breath because of your expanding uterus.  You may notice the fetus "dropping," or moving lower in your abdomen.  You may have a bloody mucus discharge. This usually occurs a few days to a week before labor begins.  Your cervix becomes thin and soft (effaced) near your due date. WHAT TO EXPECT AT YOUR PRENATAL  EXAMS  You will have prenatal exams every 2 weeks until week 36. Then, you will have weekly prenatal exams. During a routine prenatal visit:  You will be weighed to make sure you and the fetus are growing normally.  Your blood pressure is taken.  Your abdomen will be measured to track your baby's growth.  The fetal heartbeat will be listened to.  Any test results from the previous visit will be discussed.  You may have a cervical check near your due date to see if you have effaced. At around 36 weeks, your caregiver will check your cervix. At the same time, your caregiver will also perform a test on the secretions of the vaginal tissue. This test is to determine if a type of bacteria, Group B streptococcus, is present. Your caregiver will explain this further. Your caregiver may ask you:  What your birth plan is.  How you are feeling.  If you are feeling the baby move.  If you have had any abnormal symptoms, such as leaking fluid, bleeding, severe headaches, or abdominal cramping.  If you have any questions. Other tests or screenings that may be performed during your third trimester include:  Blood tests that check for low iron levels (anemia).  Fetal testing to check the health, activity level, and growth of the fetus. Testing is done if you have certain medical conditions or if there are problems during the pregnancy. FALSE LABOR You may feel small, irregular contractions that   eventually go away. These are called Braxton Hicks contractions, or false labor. Contractions may last for hours, days, or even weeks before true labor sets in. If contractions come at regular intervals, intensify, or become painful, it is best to be seen by your caregiver.  SIGNS OF LABOR   Menstrual-like cramps.  Contractions that are 5 minutes apart or less.  Contractions that start on the top of the uterus and spread down to the lower abdomen and back.  A sense of increased pelvic pressure or back  pain.  A watery or bloody mucus discharge that comes from the vagina. If you have any of these signs before the 37th week of pregnancy, call your caregiver right away. You need to go to the hospital to get checked immediately. HOME CARE INSTRUCTIONS   Avoid all smoking, herbs, alcohol, and unprescribed drugs. These chemicals affect the formation and growth of the baby.  Follow your caregiver's instructions regarding medicine use. There are medicines that are either safe or unsafe to take during pregnancy.  Exercise only as directed by your caregiver. Experiencing uterine cramps is a good sign to stop exercising.  Continue to eat regular, healthy meals.  Wear a good support bra for breast tenderness.  Do not use hot tubs, steam rooms, or saunas.  Wear your seat belt at all times when driving.  Avoid raw meat, uncooked cheese, cat litter boxes, and soil used by cats. These carry germs that can cause birth defects in the baby.  Take your prenatal vitamins.  Try taking a stool softener (if your caregiver approves) if you develop constipation. Eat more high-fiber foods, such as fresh vegetables or fruit and whole grains. Drink plenty of fluids to keep your urine clear or pale yellow.  Take warm sitz baths to soothe any pain or discomfort caused by hemorrhoids. Use hemorrhoid cream if your caregiver approves.  If you develop varicose veins, wear support hose. Elevate your feet for 15 minutes, 3-4 times a day. Limit salt in your diet.  Avoid heavy lifting, wear low heal shoes, and practice good posture.  Rest a lot with your legs elevated if you have leg cramps or low back pain.  Visit your dentist if you have not gone during your pregnancy. Use a soft toothbrush to brush your teeth and be gentle when you floss.  A sexual relationship may be continued unless your caregiver directs you otherwise.  Do not travel far distances unless it is absolutely necessary and only with the approval  of your caregiver.  Take prenatal classes to understand, practice, and ask questions about the labor and delivery.  Make a trial run to the hospital.  Pack your hospital bag.  Prepare the baby's nursery.  Continue to go to all your prenatal visits as directed by your caregiver. SEEK MEDICAL CARE IF:  You are unsure if you are in labor or if your water has broken.  You have dizziness.  You have mild pelvic cramps, pelvic pressure, or nagging pain in your abdominal area.  You have persistent nausea, vomiting, or diarrhea.  You have a bad smelling vaginal discharge.  You have pain with urination. SEEK IMMEDIATE MEDICAL CARE IF:   You have a fever.  You are leaking fluid from your vagina.  You have spotting or bleeding from your vagina.  You have severe abdominal cramping or pain.  You have rapid weight loss or gain.  You have shortness of breath with chest pain.  You notice sudden or extreme swelling   of your face, hands, ankles, feet, or legs.  You have not felt your baby move in over an hour.  You have severe headaches that do not go away with medicine.  You have vision changes. Document Released: 11/25/2001 Document Revised: 12/06/2013 Document Reviewed: 02/01/2013 [employer] Patient Information 2015 [employer]. This information is not intended to replace advice given to you by your health care provider. Make sure you discuss any questions you have with your health care provider. Etonogestrel implant What is this medicine? ETONOGESTREL (et oh noe JES trel) is a contraceptive (birth control) device. It is used to prevent pregnancy. It can be used for up to 3 years. This medicine may be used for other purposes; ask your health care provider or pharmacist if you have questions. COMMON BRAND NAME(S): Implanon, Nexplanon What should I tell my health care provider before I take this medicine? They need to know if you have any of these conditions: -abnormal vaginal  bleeding -blood vessel disease or blood clots -cancer of the breast, cervix, or liver -depression -diabetes -gallbladder disease -headaches -heart disease or recent heart attack -high blood pressure -high cholesterol -kidney disease -liver disease -renal disease -seizures -tobacco smoker -an unusual or allergic reaction to etonogestrel, other hormones, anesthetics or antiseptics, medicines, foods, dyes, or preservatives -pregnant or trying to get pregnant -breast-feeding How should I use this medicine? This device is inserted just under the skin on the inner side of your upper arm by a health care professional. Talk to your pediatrician regarding the use of this medicine in children. Special care may be needed. Overdosage: If you think you've taken too much of this medicine contact a poison control center or emergency room at once. Overdosage: If you think you have taken too much of this medicine contact a poison control center or emergency room at once. NOTE: This medicine is only for you. Do not share this medicine with others. What if I miss a dose? This does not apply. What may interact with this medicine? Do not take this medicine with any of the following medications: -amprenavir -bosentan -fosamprenavir This medicine may also interact with the following medications: -barbiturate medicines for inducing sleep or treating seizures -certain medicines for fungal infections like ketoconazole and itraconazole -griseofulvin -medicines to treat seizures like carbamazepine, felbamate, oxcarbazepine, phenytoin, topiramate -modafinil -phenylbutazone -rifampin -some medicines to treat HIV infection like atazanavir, indinavir, lopinavir, nelfinavir, tipranavir, ritonavir -St. John's wort This list may not describe all possible interactions. Give your health care provider a list of all the medicines, herbs, non-prescription drugs, or dietary supplements you use. Also tell them if you  smoke, drink alcohol, or use illegal drugs. Some items may interact with your medicine. What should I watch for while using this medicine? This product does not protect you against HIV infection (AIDS) or other sexually transmitted diseases. You should be able to feel the implant by pressing your fingertips over the skin where it was inserted. Tell your doctor if you cannot feel the implant. What side effects may I notice from receiving this medicine? Side effects that you should report to your doctor or health care professional as soon as possible: -allergic reactions like skin rash, itching or hives, swelling of the face, lips, or tongue -breast lumps -changes in vision -confusion, trouble speaking or understanding -dark urine -depressed mood -general ill feeling or flu-like symptoms -light-colored stools -loss of appetite, nausea -right upper belly pain -severe headaches -severe pain, swelling, or tenderness in the abdomen -shortness of breath, chest   pain, swelling in a leg -signs of pregnancy -sudden numbness or weakness of the face, arm or leg -trouble walking, dizziness, loss of balance or coordination -unusual vaginal bleeding, discharge -unusually weak or tired -yellowing of the eyes or skin Side effects that usually do not require medical attention (Report these to your doctor or health care professional if they continue or are bothersome.): -acne -breast pain -changes in weight -cough -fever or chills -headache -irregular menstrual bleeding -itching, burning, and vaginal discharge -pain or difficulty passing urine -sore throat This list may not describe all possible side effects. Call your doctor for medical advice about side effects. You may report side effects to FDA at 1-800-FDA-1088. Where should I keep my medicine? This drug is given in a hospital or clinic and will not be stored at home. NOTE: This sheet is a summary. It may not cover all possible information.  If you have questions about this medicine, talk to your doctor, pharmacist, or health care provider.  2015, [employer]. (2012-06-07 15:37:45)  

## 2014-10-12 LAB — GLUCOSE TOLERANCE, 1 HOUR (50G) W/O FASTING: GLUCOSE 1 HOUR GTT: 93 mg/dL (ref 70–140)

## 2014-10-16 ENCOUNTER — Encounter: Payer: Self-pay | Admitting: Obstetrics and Gynecology

## 2014-10-25 ENCOUNTER — Encounter: Payer: Medicaid Other | Admitting: Advanced Practice Midwife

## 2014-10-30 ENCOUNTER — Ambulatory Visit (INDEPENDENT_AMBULATORY_CARE_PROVIDER_SITE_OTHER): Payer: Medicaid Other | Admitting: Family Medicine

## 2014-10-30 VITALS — BP 113/72 | HR 87 | Wt 118.0 lb

## 2014-10-30 DIAGNOSIS — Z3482 Encounter for supervision of other normal pregnancy, second trimester: Secondary | ICD-10-CM

## 2014-10-30 DIAGNOSIS — Z3492 Encounter for supervision of normal pregnancy, unspecified, second trimester: Secondary | ICD-10-CM

## 2014-10-30 LAB — POCT URINALYSIS DIP (DEVICE)
Bilirubin Urine: NEGATIVE
GLUCOSE, UA: NEGATIVE mg/dL
Hgb urine dipstick: NEGATIVE
Ketones, ur: NEGATIVE mg/dL
Leukocytes, UA: NEGATIVE
NITRITE: NEGATIVE
Protein, ur: NEGATIVE mg/dL
Specific Gravity, Urine: 1.02 (ref 1.005–1.030)
UROBILINOGEN UA: 0.2 mg/dL (ref 0.0–1.0)
pH: 7.5 (ref 5.0–8.0)

## 2014-10-30 NOTE — Progress Notes (Signed)
Doing well, no complaints. + FM, no VB, no discharge, no contractions, no LOF.  F/U anatomy US ordered, but not yet scheduled for incomplete views of heart.

## 2014-10-30 NOTE — Progress Notes (Signed)
Follow-up U/S with Radiology 10/31/14 @ 815a.

## 2014-10-31 ENCOUNTER — Ambulatory Visit (HOSPITAL_COMMUNITY)
Admission: RE | Admit: 2014-10-31 | Discharge: 2014-10-31 | Disposition: A | Discharge status: Home / Self Care | Payer: Medicaid Other | Source: Ambulatory Visit | Attending: Obstetrics and Gynecology | Admitting: Obstetrics and Gynecology

## 2014-10-31 DIAGNOSIS — Z36 Encounter for antenatal screening of mother: Secondary | ICD-10-CM | POA: Diagnosis not present

## 2014-10-31 DIAGNOSIS — Z3A30 30 weeks gestation of pregnancy: Secondary | ICD-10-CM | POA: Diagnosis not present

## 2014-10-31 DIAGNOSIS — Z3492 Encounter for supervision of normal pregnancy, unspecified, second trimester: Secondary | ICD-10-CM

## 2014-11-13 ENCOUNTER — Ambulatory Visit (INDEPENDENT_AMBULATORY_CARE_PROVIDER_SITE_OTHER): Payer: Medicaid Other | Admitting: Obstetrics and Gynecology

## 2014-11-13 VITALS — BP 111/60 | HR 75 | Temp 97.8°F | Wt 119.7 lb

## 2014-11-13 DIAGNOSIS — Z3492 Encounter for supervision of normal pregnancy, unspecified, second trimester: Secondary | ICD-10-CM

## 2014-11-13 DIAGNOSIS — Z3482 Encounter for supervision of other normal pregnancy, second trimester: Secondary | ICD-10-CM

## 2014-11-13 LAB — POCT URINALYSIS DIP (DEVICE)
BILIRUBIN URINE: NEGATIVE
GLUCOSE, UA: NEGATIVE mg/dL
Hgb urine dipstick: NEGATIVE
Ketones, ur: NEGATIVE mg/dL
NITRITE: NEGATIVE
Protein, ur: NEGATIVE mg/dL
Specific Gravity, Urine: 1.02 (ref 1.005–1.030)
Urobilinogen, UA: 0.2 mg/dL (ref 0.0–1.0)
pH: 7 (ref 5.0–8.0)

## 2014-11-13 NOTE — Progress Notes (Signed)
Doing well, no complaints today.  1. Routine PNC. Reviewed labs, up to date. FM/PTL precautions reviewed.

## 2014-11-28 ENCOUNTER — Ambulatory Visit (INDEPENDENT_AMBULATORY_CARE_PROVIDER_SITE_OTHER): Payer: Medicaid Other | Admitting: Physician Assistant

## 2014-11-28 VITALS — BP 102/66 | HR 76 | Temp 97.9°F | Wt 121.8 lb

## 2014-11-28 DIAGNOSIS — Z3483 Encounter for supervision of other normal pregnancy, third trimester: Secondary | ICD-10-CM

## 2014-11-28 LAB — POCT URINALYSIS DIP (DEVICE)
BILIRUBIN URINE: NEGATIVE
Glucose, UA: NEGATIVE mg/dL
Hgb urine dipstick: NEGATIVE
KETONES UR: NEGATIVE mg/dL
Nitrite: NEGATIVE
PROTEIN: NEGATIVE mg/dL
Specific Gravity, Urine: 1.015 (ref 1.005–1.030)
Urobilinogen, UA: 0.2 mg/dL (ref 0.0–1.0)
pH: 7.5 (ref 5.0–8.0)

## 2014-11-28 NOTE — Patient Instructions (Signed)
Breastfeeding Deciding to breastfeed is one of the best choices you can make for you and your baby. A change in hormones during pregnancy causes your breast tissue to grow and increases the number and size of your milk ducts. These hormones also allow proteins, sugars, and fats from your blood supply to make breast milk in your milk-producing glands. Hormones prevent breast milk from being released before your baby is born as well as prompt milk flow after birth. Once breastfeeding has begun, thoughts of your baby, as well as his or her sucking or crying, can stimulate the release of milk from your milk-producing glands.  BENEFITS OF BREASTFEEDING For Your Baby  Your first milk (colostrum) helps your baby's digestive system function better.   There are antibodies in your milk that help your baby fight off infections.   Your baby has a lower incidence of asthma, allergies, and sudden infant death syndrome.   The nutrients in breast milk are better for your baby than infant formulas and are designed uniquely for your baby's needs.   Breast milk improves your baby's brain development.   Your baby is less likely to develop other conditions, such as childhood obesity, asthma, or type 2 diabetes mellitus.  For You   Breastfeeding helps to create a very special bond between you and your baby.   Breastfeeding is convenient. Breast milk is always available at the correct temperature and costs nothing.   Breastfeeding helps to burn calories and helps you lose the weight gained during pregnancy.   Breastfeeding makes your uterus contract to its prepregnancy size faster and slows bleeding (lochia) after you give birth.   Breastfeeding helps to lower your risk of developing type 2 diabetes mellitus, osteoporosis, and breast or ovarian cancer later in life. SIGNS THAT YOUR BABY IS HUNGRY Early Signs of Hunger  Increased alertness or activity.  Stretching.  Movement of the head from  side to side.  Movement of the head and opening of the mouth when the corner of the mouth or cheek is stroked (rooting).  Increased sucking sounds, smacking lips, cooing, sighing, or squeaking.  Hand-to-mouth movements.  Increased sucking of fingers or hands. Late Signs of Hunger  Fussing.  Intermittent crying. Extreme Signs of Hunger Signs of extreme hunger will require calming and consoling before your baby will be able to breastfeed successfully. Do not wait for the following signs of extreme hunger to occur before you initiate breastfeeding:   Restlessness.  A loud, strong cry.   Screaming. BREASTFEEDING BASICS Breastfeeding Initiation  Find a comfortable place to sit or lie down, with your neck and back well supported.  Place a pillow or rolled up blanket under your baby to bring him or her to the level of your breast (if you are seated). Nursing pillows are specially designed to help support your arms and your baby while you breastfeed.  Make sure that your baby's abdomen is facing your abdomen.   Gently massage your breast. With your fingertips, massage from your chest wall toward your nipple in a circular motion. This encourages milk flow. You may need to continue this action during the feeding if your milk flows slowly.  Support your breast with 4 fingers underneath and your thumb above your nipple. Make sure your fingers are well away from your nipple and your baby's mouth.   Stroke your baby's lips gently with your finger or nipple.   When your baby's mouth is open wide enough, quickly bring your baby to your   breast, placing your entire nipple and as much of the colored area around your nipple (areola) as possible into your baby's mouth.   More areola should be visible above your baby's upper lip than below the lower lip.   Your baby's tongue should be between his or her lower gum and your breast.   Ensure that your baby's mouth is correctly positioned  around your nipple (latched). Your baby's lips should create a seal on your breast and be turned out (everted).  It is common for your baby to suck about 2-3 minutes in order to start the flow of breast milk. Latching Teaching your baby how to latch on to your breast properly is very important. An improper latch can cause nipple pain and decreased milk supply for you and poor weight gain in your baby. Also, if your baby is not latched onto your nipple properly, he or she may swallow some air during feeding. This can make your baby fussy. Burping your baby when you switch breasts during the feeding can help to get rid of the air. However, teaching your baby to latch on properly is still the best way to prevent fussiness from swallowing air while breastfeeding. Signs that your baby has successfully latched on to your nipple:    Silent tugging or silent sucking, without causing you pain.   Swallowing heard between every 3-4 sucks.    Muscle movement above and in front of his or her ears while sucking.  Signs that your baby has not successfully latched on to nipple:   Sucking sounds or smacking sounds from your baby while breastfeeding.  Nipple pain. If you think your baby has not latched on correctly, slip your finger into the corner of your baby's mouth to break the suction and place it between your baby's gums. Attempt breastfeeding initiation again. Signs of Successful Breastfeeding Signs from your baby:   A gradual decrease in the number of sucks or complete cessation of sucking.   Falling asleep.   Relaxation of his or her body.   Retention of a small amount of milk in his or her mouth.   Letting go of your breast by himself or herself. Signs from you:  Breasts that have increased in firmness, weight, and size 1-3 hours after feeding.   Breasts that are softer immediately after breastfeeding.  Increased milk volume, as well as a change in milk consistency and color by  the fifth day of breastfeeding.   Nipples that are not sore, cracked, or bleeding. Signs That Your Baby is Getting Enough Milk  Wetting at least 3 diapers in a 24-hour period. The urine should be clear and pale yellow by age 5 days.  At least 3 stools in a 24-hour period by age 5 days. The stool should be soft and yellow.  At least 3 stools in a 24-hour period by age 7 days. The stool should be seedy and yellow.  No loss of weight greater than 10% of birth weight during the first 3 days of age.  Average weight gain of 4-7 ounces (113-198 g) per week after age 4 days.  Consistent daily weight gain by age 5 days, without weight loss after the age of 2 weeks. After a feeding, your baby may spit up a small amount. This is common. BREASTFEEDING FREQUENCY AND DURATION Frequent feeding will help you make more milk and can prevent sore nipples and breast engorgement. Breastfeed when you feel the need to reduce the fullness of your breasts   or when your baby shows signs of hunger. This is called "breastfeeding on demand." Avoid introducing a pacifier to your baby while you are working to establish breastfeeding (the first 4-6 weeks after your baby is born). After this time you may choose to use a pacifier. Research has shown that pacifier use during the first year of a baby's life decreases the risk of sudden infant death syndrome (SIDS). Allow your baby to feed on each breast as long as he or she wants. Breastfeed until your baby is finished feeding. When your baby unlatches or falls asleep while feeding from the first breast, offer the second breast. Because newborns are often sleepy in the first few weeks of life, you may need to awaken your baby to get him or her to feed. Breastfeeding times will vary from baby to baby. However, the following rules can serve as a guide to help you ensure that your baby is properly fed:  Newborns (babies 4 weeks of age or younger) may breastfeed every 1-3  hours.  Newborns should not go longer than 3 hours during the day or 5 hours during the night without breastfeeding.  You should breastfeed your baby a minimum of 8 times in a 24-hour period until you begin to introduce solid foods to your baby at around 6 months of age. BREAST MILK PUMPING Pumping and storing breast milk allows you to ensure that your baby is exclusively fed your breast milk, even at times when you are unable to breastfeed. This is especially important if you are going back to work while you are still breastfeeding or when you are not able to be present during feedings. Your lactation consultant can give you guidelines on how long it is safe to store breast milk.  A breast pump is a machine that allows you to pump milk from your breast into a sterile bottle. The pumped breast milk can then be stored in a refrigerator or freezer. Some breast pumps are operated by hand, while others use electricity. Ask your lactation consultant which type will work best for you. Breast pumps can be purchased, but some hospitals and breastfeeding support groups lease breast pumps on a monthly basis. A lactation consultant can teach you how to hand express breast milk, if you prefer not to use a pump.  CARING FOR YOUR BREASTS WHILE YOU BREASTFEED Nipples can become dry, cracked, and sore while breastfeeding. The following recommendations can help keep your breasts moisturized and healthy:  Avoid using soap on your nipples.   Wear a supportive bra. Although not required, special nursing bras and tank tops are designed to allow access to your breasts for breastfeeding without taking off your entire bra or top. Avoid wearing underwire-style bras or extremely tight bras.  Air dry your nipples for 3-4minutes after each feeding.   Use only cotton bra pads to absorb leaked breast milk. Leaking of breast milk between feedings is normal.   Use lanolin on your nipples after breastfeeding. Lanolin helps to  maintain your skin's normal moisture barrier. If you use pure lanolin, you do not need to wash it off before feeding your baby again. Pure lanolin is not toxic to your baby. You may also hand express a few drops of breast milk and gently massage that milk into your nipples and allow the milk to air dry. In the first few weeks after giving birth, some women experience extremely full breasts (engorgement). Engorgement can make your breasts feel heavy, warm, and tender to the   touch. Engorgement peaks within 3-5 days after you give birth. The following recommendations can help ease engorgement:  Completely empty your breasts while breastfeeding or pumping. You may want to start by applying warm, moist heat (in the shower or with warm water-soaked hand towels) just before feeding or pumping. This increases circulation and helps the milk flow. If your baby does not completely empty your breasts while breastfeeding, pump any extra milk after he or she is finished.  Wear a snug bra (nursing or regular) or tank top for 1-2 days to signal your body to slightly decrease milk production.  Apply ice packs to your breasts, unless this is too uncomfortable for you.  Make sure that your baby is latched on and positioned properly while breastfeeding. If engorgement persists after 48 hours of following these recommendations, contact your health care provider or a lactation consultant. OVERALL HEALTH CARE RECOMMENDATIONS WHILE BREASTFEEDING  Eat healthy foods. Alternate between meals and snacks, eating 3 of each per day. Because what you eat affects your breast milk, some of the foods may make your baby more irritable than usual. Avoid eating these foods if you are sure that they are negatively affecting your baby.  Drink milk, fruit juice, and water to satisfy your thirst (about 10 glasses a day).   Rest often, relax, and continue to take your prenatal vitamins to prevent fatigue, stress, and anemia.  Continue  breast self-awareness checks.  Avoid chewing and smoking tobacco.  Avoid alcohol and drug use. Some medicines that may be harmful to your baby can pass through breast milk. It is important to ask your health care provider before taking any medicine, including all over-the-counter and prescription medicine as well as vitamin and herbal supplements. It is possible to become pregnant while breastfeeding. If birth control is desired, ask your health care provider about options that will be safe for your baby. SEEK MEDICAL CARE IF:   You feel like you want to stop breastfeeding or have become frustrated with breastfeeding.  You have painful breasts or nipples.  Your nipples are cracked or bleeding.  Your breasts are red, tender, or warm.  You have a swollen area on either breast.  You have a fever or chills.  You have nausea or vomiting.  You have drainage other than breast milk from your nipples.  Your breasts do not become full before feedings by the fifth day after you give birth.  You feel sad and depressed.  Your baby is too sleepy to eat well.  Your baby is having trouble sleeping.   Your baby is wetting less than 3 diapers in a 24-hour period.  Your baby has less than 3 stools in a 24-hour period.  Your baby's skin or the white part of his or her eyes becomes yellow.   Your baby is not gaining weight by 5 days of age. SEEK IMMEDIATE MEDICAL CARE IF:   Your baby is overly tired (lethargic) and does not want to wake up and feed.  Your baby develops an unexplained fever. Document Released: 12/01/2005 Document Revised: 12/06/2013 Document Reviewed: 05/25/2013 Exit[employer] Patient Information 2015 [employer]. This information is not intended to replace advice given to you by your health care provider. Make sure you discuss any questions you have with your health care provider.  

## 2014-11-28 NOTE — Progress Notes (Signed)
34 weeks, stable.  Denies contractions/LOF/vag bleeding/dysuria.  Endorses good fetal movement.   PTL precautions discussed PNV qd RTC 1 week

## 2014-12-06 ENCOUNTER — Other Ambulatory Visit: Payer: Self-pay | Admitting: Family

## 2014-12-06 ENCOUNTER — Ambulatory Visit (INDEPENDENT_AMBULATORY_CARE_PROVIDER_SITE_OTHER): Payer: Medicaid Other | Admitting: Family

## 2014-12-06 VITALS — BP 98/63 | HR 83 | Temp 98.0°F | Wt 122.5 lb

## 2014-12-06 DIAGNOSIS — Z3492 Encounter for supervision of normal pregnancy, unspecified, second trimester: Secondary | ICD-10-CM

## 2014-12-06 DIAGNOSIS — Z3493 Encounter for supervision of normal pregnancy, unspecified, third trimester: Secondary | ICD-10-CM

## 2014-12-06 LAB — POCT URINALYSIS DIP (DEVICE)
Bilirubin Urine: NEGATIVE
Glucose, UA: NEGATIVE mg/dL
Hgb urine dipstick: NEGATIVE
Ketones, ur: NEGATIVE mg/dL
Nitrite: NEGATIVE
PROTEIN: NEGATIVE mg/dL
Specific Gravity, Urine: 1.015 (ref 1.005–1.030)
UROBILINOGEN UA: 0.2 mg/dL (ref 0.0–1.0)
pH: 7.5 (ref 5.0–8.0)

## 2014-12-06 LAB — OB RESULTS CONSOLE GBS: STREP GROUP B AG: NEGATIVE

## 2014-12-06 NOTE — Progress Notes (Signed)
No complaints today, Cultures today

## 2014-12-06 NOTE — Progress Notes (Signed)
Doing well; no question or concerns.  GBS and GC and chlamydia collected.

## 2014-12-07 LAB — GC/CHLAMYDIA PROBE AMP
CT Probe RNA: NEGATIVE
GC Probe RNA: NEGATIVE

## 2014-12-08 LAB — CULTURE, BETA STREP (GROUP B ONLY)

## 2014-12-13 ENCOUNTER — Ambulatory Visit (INDEPENDENT_AMBULATORY_CARE_PROVIDER_SITE_OTHER): Payer: Medicaid Other | Admitting: Family

## 2014-12-13 VITALS — BP 107/64 | HR 73 | Temp 97.6°F | Wt 123.8 lb

## 2014-12-13 DIAGNOSIS — Z3492 Encounter for supervision of normal pregnancy, unspecified, second trimester: Secondary | ICD-10-CM

## 2014-12-13 DIAGNOSIS — Z3493 Encounter for supervision of normal pregnancy, unspecified, third trimester: Secondary | ICD-10-CM

## 2014-12-13 LAB — POCT URINALYSIS DIP (DEVICE)
Bilirubin Urine: NEGATIVE
Glucose, UA: NEGATIVE mg/dL
Hgb urine dipstick: NEGATIVE
KETONES UR: NEGATIVE mg/dL
Nitrite: NEGATIVE
PH: 7.5 (ref 5.0–8.0)
PROTEIN: NEGATIVE mg/dL
Specific Gravity, Urine: 1.02 (ref 1.005–1.030)
Urobilinogen, UA: 0.2 mg/dL (ref 0.0–1.0)

## 2014-12-13 NOTE — Progress Notes (Signed)
Doing well; no questions or concerns.  .Reviewed GBS and GC/CT results.

## 2014-12-15 NOTE — L&D Delivery Note (Signed)
Delivery Note At 3:33 PM a viable female was delivered via Vaginal, Vacuum (Extractor) (Presentation: Left Occiput Anterior). weight 6 lb 3.1 oz (2809 g).   Placenta status: Intact, Spontaneous.  Cord: 3 vessels with the following complications: None.   Anesthesia: None  Episiotomy: None Lacerations: None Est. Blood Loss (mL): 250  Mom to postpartum.  Baby to Couplet care / Skin to Skin.  [name], Lori Cardenas 01/03/2015, 5:26 PM    Operative Delivery Note At 3:33 PM a viable female was delivered via Vaginal, Vacuum (Extractor).  Presentation: vertex; Position: roa; Station: +2.  Vacuum used 2/2 decelerations which were not initially noted 2/2 FHT 150s which were actually coupling of maternal/fetal heart tones, subsequently noted to be in 60s-70s likely for 2-3 minutes.  No pop-offs.  Verbal consent: obtained from patient.  Risks and benefits discussed in detail.  Risks include, but are not limited to the risks of anesthesia, bleeding, infection, damage to maternal tissues, fetal cephalhematoma.  There is also the risk of inability to effect vaginal delivery of the head, or shoulder dystocia that cannot be resolved by established maneuvers, leading to the need for emergency cesarean section.  APGAR: , ; weight 6 lb 3.1 oz (2809 g).   Placenta status: Intact, Spontaneous.   Cord: 3 vessels with the following complications: None.  Anesthesia: None  Instruments: Kiwi vacuum Episiotomy: None Lacerations: None Suture Repair: n/a Est. Blood Loss (mL): 250  Mom to postpartum.  Baby to Couplet care / Skin to Skin.  Lori Cardenas Lori Cardenas 01/03/2015, 5:41 PM

## 2014-12-20 ENCOUNTER — Ambulatory Visit (INDEPENDENT_AMBULATORY_CARE_PROVIDER_SITE_OTHER): Payer: Medicaid Other | Admitting: Advanced Practice Midwife

## 2014-12-20 VITALS — BP 108/64 | HR 69 | Temp 97.4°F | Wt 124.7 lb

## 2014-12-20 DIAGNOSIS — Z3492 Encounter for supervision of normal pregnancy, unspecified, second trimester: Secondary | ICD-10-CM

## 2014-12-20 DIAGNOSIS — Z3482 Encounter for supervision of other normal pregnancy, second trimester: Secondary | ICD-10-CM

## 2014-12-20 LAB — POCT URINALYSIS DIP (DEVICE)
Bilirubin Urine: NEGATIVE
GLUCOSE, UA: NEGATIVE mg/dL
Hgb urine dipstick: NEGATIVE
Ketones, ur: NEGATIVE mg/dL
Leukocytes, UA: NEGATIVE
Nitrite: NEGATIVE
Protein, ur: NEGATIVE mg/dL
SPECIFIC GRAVITY, URINE: 1.02 (ref 1.005–1.030)
UROBILINOGEN UA: 0.2 mg/dL (ref 0.0–1.0)
pH: 7 (ref 5.0–8.0)

## 2014-12-20 NOTE — Progress Notes (Signed)
Doing well.  Good fetal movement, denies vaginal bleeding, LOF, regular contractions.   Some irregular cramping last night that resolved.  Reviewed signs of labor/reasons to come to hospital.

## 2014-12-27 ENCOUNTER — Ambulatory Visit (INDEPENDENT_AMBULATORY_CARE_PROVIDER_SITE_OTHER): Payer: Medicaid Other | Admitting: Family

## 2014-12-27 VITALS — BP 108/61 | HR 76 | Temp 97.5°F | Wt 123.9 lb

## 2014-12-27 DIAGNOSIS — Z3493 Encounter for supervision of normal pregnancy, unspecified, third trimester: Secondary | ICD-10-CM

## 2014-12-27 LAB — POCT URINALYSIS DIP (DEVICE)
Bilirubin Urine: NEGATIVE
GLUCOSE, UA: NEGATIVE mg/dL
HGB URINE DIPSTICK: NEGATIVE
KETONES UR: NEGATIVE mg/dL
Leukocytes, UA: NEGATIVE
NITRITE: NEGATIVE
Protein, ur: NEGATIVE mg/dL
SPECIFIC GRAVITY, URINE: 1.015 (ref 1.005–1.030)
Urobilinogen, UA: 0.2 mg/dL (ref 0.0–1.0)
pH: 7 (ref 5.0–8.0)

## 2014-12-27 NOTE — Progress Notes (Signed)
Reports irregular contractions, every 10 minutes for about half hour.  No bleeding or leaking of fluid.  Reviewed signs of labor.

## 2015-01-03 ENCOUNTER — Inpatient Hospital Stay (HOSPITAL_COMMUNITY)
Admission: AD | Admit: 2015-01-03 | Discharge: 2015-01-04 | DRG: 774 | Disposition: A | Discharge status: Home / Self Care | Payer: Medicaid Other | Source: Ambulatory Visit | Attending: Obstetrics & Gynecology | Admitting: Obstetrics & Gynecology

## 2015-01-03 ENCOUNTER — Encounter (HOSPITAL_COMMUNITY): Payer: Self-pay | Admitting: *Deleted

## 2015-01-03 ENCOUNTER — Ambulatory Visit (INDEPENDENT_AMBULATORY_CARE_PROVIDER_SITE_OTHER): Payer: Medicaid Other | Admitting: Obstetrics and Gynecology

## 2015-01-03 VITALS — BP 113/73 | HR 72 | Temp 97.5°F | Wt 127.2 lb

## 2015-01-03 DIAGNOSIS — Z3A40 40 weeks gestation of pregnancy: Secondary | ICD-10-CM | POA: Diagnosis present

## 2015-01-03 DIAGNOSIS — Z3492 Encounter for supervision of normal pregnancy, unspecified, second trimester: Secondary | ICD-10-CM

## 2015-01-03 DIAGNOSIS — O480 Post-term pregnancy: Secondary | ICD-10-CM | POA: Diagnosis present

## 2015-01-03 DIAGNOSIS — IMO0001 Reserved for inherently not codable concepts without codable children: Secondary | ICD-10-CM

## 2015-01-03 LAB — CBC
HEMATOCRIT: 37.1 % (ref 36.0–46.0)
HEMOGLOBIN: 13.3 g/dL (ref 12.0–15.0)
MCH: 31.7 pg (ref 26.0–34.0)
MCHC: 35.8 g/dL (ref 30.0–36.0)
MCV: 88.3 fL (ref 78.0–100.0)
Platelets: 180 10*3/uL (ref 150–400)
RBC: 4.2 MIL/uL (ref 3.87–5.11)
RDW: 13.6 % (ref 11.5–15.5)
WBC: 9.5 10*3/uL (ref 4.0–10.5)

## 2015-01-03 LAB — TYPE AND SCREEN
ABO/RH(D): AB POS
Antibody Screen: NEGATIVE

## 2015-01-03 LAB — POCT URINALYSIS DIP (DEVICE)
Glucose, UA: NEGATIVE mg/dL
Ketones, ur: NEGATIVE mg/dL
LEUKOCYTES UA: NEGATIVE
NITRITE: NEGATIVE
PROTEIN: NEGATIVE mg/dL
Specific Gravity, Urine: 1.02 (ref 1.005–1.030)
Urobilinogen, UA: 0.2 mg/dL (ref 0.0–1.0)
pH: 7 (ref 5.0–8.0)

## 2015-01-03 LAB — ABO/RH: ABO/RH(D): AB POS

## 2015-01-03 MED ORDER — OXYTOCIN BOLUS FROM INFUSION
500.0000 mL | INTRAVENOUS | Status: DC
  Administered 2015-01-03: 500 mL via INTRAVENOUS

## 2015-01-03 MED ORDER — DIPHENHYDRAMINE HCL 25 MG PO CAPS: 25.0000 mg | ORAL_CAPSULE | Freq: Four times a day (QID) | ORAL | Status: DC | PRN

## 2015-01-03 MED ORDER — OXYTOCIN 40 UNITS IN LACTATED RINGERS INFUSION - SIMPLE MED
62.5000 mL/h | INTRAVENOUS | Status: DC
  Administered 2015-01-03: 62.5 mL/h via INTRAVENOUS
  Filled 2015-01-03: qty 1000

## 2015-01-03 MED ORDER — TETANUS-DIPHTH-ACELL PERTUSSIS 5-2.5-18.5 LF-MCG/0.5 IM SUSP: 0.5000 mL | Freq: Once | INTRAMUSCULAR | Status: DC

## 2015-01-03 MED ORDER — OXYCODONE-ACETAMINOPHEN 5-325 MG PO TABS: 1.0000 | ORAL_TABLET | ORAL | Status: DC | PRN

## 2015-01-03 MED ORDER — ACETAMINOPHEN 325 MG PO TABS: 650.0000 mg | ORAL_TABLET | ORAL | Status: DC | PRN

## 2015-01-03 MED ORDER — WITCH HAZEL-GLYCERIN EX PADS: 1.0000 "application " | MEDICATED_PAD | CUTANEOUS | Status: DC | PRN

## 2015-01-03 MED ORDER — METHYLERGONOVINE MALEATE 0.2 MG/ML IJ SOLN
0.2000 mg | Freq: Once | INTRAMUSCULAR | Status: AC
  Administered 2015-01-03: 0.2 mg via INTRAMUSCULAR

## 2015-01-03 MED ORDER — PRENATAL MULTIVITAMIN CH
1.0000 | ORAL_TABLET | Freq: Every day | ORAL | Status: DC
  Administered 2015-01-04: 1 via ORAL
  Filled 2015-01-03: qty 1

## 2015-01-03 MED ORDER — LACTATED RINGERS IV SOLN: 500.0000 mL | INTRAVENOUS | Status: DC | PRN

## 2015-01-03 MED ORDER — OXYCODONE-ACETAMINOPHEN 5-325 MG PO TABS: 2.0000 | ORAL_TABLET | ORAL | Status: DC | PRN

## 2015-01-03 MED ORDER — BENZOCAINE-MENTHOL 20-0.5 % EX AERO
1.0000 "application " | INHALATION_SPRAY | CUTANEOUS | Status: DC | PRN
  Filled 2015-01-03: qty 56

## 2015-01-03 MED ORDER — IBUPROFEN 600 MG PO TABS
600.0000 mg | ORAL_TABLET | Freq: Four times a day (QID) | ORAL | Status: DC
  Administered 2015-01-04 (×3): 600 mg via ORAL
  Filled 2015-01-03 (×3): qty 1

## 2015-01-03 MED ORDER — SENNOSIDES-DOCUSATE SODIUM 8.6-50 MG PO TABS
2.0000 | ORAL_TABLET | ORAL | Status: DC
  Administered 2015-01-04: 2 via ORAL
  Filled 2015-01-03: qty 2

## 2015-01-03 MED ORDER — LANOLIN HYDROUS EX OINT: TOPICAL_OINTMENT | CUTANEOUS | Status: DC | PRN

## 2015-01-03 MED ORDER — FENTANYL CITRATE 0.05 MG/ML IJ SOLN
100.0000 ug | INTRAMUSCULAR | Status: DC | PRN
Start: 2015-01-03 — End: 2015-01-03
  Administered 2015-01-03 (×3): 100 ug via INTRAVENOUS
  Filled 2015-01-03 (×4): qty 2

## 2015-01-03 MED ORDER — MISOPROSTOL 200 MCG PO TABS
ORAL_TABLET | ORAL | Status: AC
  Administered 2015-01-03: 1000 ug via RECTAL
  Filled 2015-01-03: qty 1

## 2015-01-03 MED ORDER — METHYLERGONOVINE MALEATE 0.2 MG/ML IJ SOLN
INTRAMUSCULAR | Status: AC
  Administered 2015-01-03: 0.2 mg via INTRAMUSCULAR
  Filled 2015-01-03: qty 1

## 2015-01-03 MED ORDER — MISOPROSTOL 200 MCG PO TABS
1000.0000 ug | ORAL_TABLET | Freq: Once | ORAL | Status: AC
  Administered 2015-01-03: 1000 ug via RECTAL

## 2015-01-03 MED ORDER — ONDANSETRON HCL 4 MG PO TABS: 4.0000 mg | ORAL_TABLET | ORAL | Status: DC | PRN

## 2015-01-03 MED ORDER — LACTATED RINGERS IV SOLN
INTRAVENOUS | Status: DC
  Administered 2015-01-03: 11:00:00 via INTRAVENOUS

## 2015-01-03 MED ORDER — ONDANSETRON HCL 4 MG/2ML IJ SOLN: 4.0000 mg | Freq: Four times a day (QID) | INTRAMUSCULAR | Status: DC | PRN

## 2015-01-03 MED ORDER — LIDOCAINE HCL (PF) 1 % IJ SOLN
30.0000 mL | INTRAMUSCULAR | Status: DC | PRN
  Administered 2015-01-03: 30 mL via SUBCUTANEOUS
  Filled 2015-01-03: qty 30

## 2015-01-03 MED ORDER — IBUPROFEN 600 MG PO TABS
600.0000 mg | ORAL_TABLET | Freq: Once | ORAL | Status: AC
  Administered 2015-01-03: 600 mg via ORAL
  Filled 2015-01-03: qty 1

## 2015-01-03 MED ORDER — PRENATAL VITAMINS 0.8 MG PO TABS: 1.0000 | ORAL_TABLET | Freq: Every day | ORAL | Status: DC

## 2015-01-03 MED ORDER — SIMETHICONE 80 MG PO CHEW: 80.0000 mg | CHEWABLE_TABLET | ORAL | Status: DC | PRN

## 2015-01-03 MED ORDER — DIBUCAINE 1 % RE OINT: 1.0000 "application " | TOPICAL_OINTMENT | RECTAL | Status: DC | PRN

## 2015-01-03 MED ORDER — ONDANSETRON HCL 4 MG/2ML IJ SOLN: 4.0000 mg | INTRAMUSCULAR | Status: DC | PRN

## 2015-01-03 MED ORDER — CITRIC ACID-SODIUM CITRATE 334-500 MG/5ML PO SOLN: 30.0000 mL | ORAL | Status: DC | PRN

## 2015-01-03 NOTE — Progress Notes (Signed)
Patient reports pelvic and back pain/pressure; reports contractions starting this morning, maybe every 5 minutes

## 2015-01-03 NOTE — Patient Instructions (Signed)

## 2015-01-03 NOTE — Progress Notes (Signed)
UCs q5-6 min, painful but copiing. No LOF or VB. GFM. SVE: 4/95/-2 cephalic. Will admit for expectant management of labor.

## 2015-01-03 NOTE — H&P (Signed)
Lori Cardenas is a 24 y.o. female G2P1001 at 1082w0d presenting for labor. Contractions started this morning. Pt was evaluated at outpatient clinic and cervical change was detected. Pt transferred to labor and delivery.  Maternal Medical History:  Reason for admission: Contractions.  Nausea.    OB History    Gravida Para Term Preterm AB TAB SAB Ectopic Multiple Living   2 1 1       1      Past Medical History  Diagnosis Date  . Medical history non-contributory    Past Surgical History  Procedure Laterality Date  . No past surgeries     Family History: family history includes Diabetes in her mother; Hypertension in her mother. Social History:  reports that she has never smoked. She has never used smokeless tobacco. She reports that she does not drink alcohol or use illicit drugs.     Review of Systems  Constitutional: Negative for fever and chills.  Respiratory: Negative for cough.   Cardiovascular: Negative for chest pain.  Gastrointestinal: Negative for nausea and vomiting.  Genitourinary: Negative for dysuria and urgency.      Blood pressure 113/80, pulse 104, temperature 97.8 F (36.6 C), temperature source Oral, resp. rate 20, height 5' (1.524 m), weight 57.607 kg (127 lb), last menstrual period 03/04/2014, unknown if currently breastfeeding. Maternal Exam:  Uterine Assessment: Contraction strength is mild.  Contraction duration is 4 minutes. Contraction frequency is regular.   Abdomen: Patient reports no abdominal tenderness.   Fetal Exam Fetal Monitor Review: Baseline rate: 150.  Variability: moderate (6-25 bpm).   Pattern: accelerations present.       Physical Exam  Constitutional: She is oriented to person, place, and time. She appears well-developed and well-nourished.  HENT:  Head: Normocephalic and atraumatic.  Neck: Normal range of motion. Neck supple.  Cardiovascular: Normal rate, regular rhythm and normal heart sounds.   Respiratory: Effort normal and  breath sounds normal.  Neurological: She is alert and oriented to person, place, and time.  Skin: Skin is warm and dry.    Prenatal labs: ABO, Rh: AB/POS/-- (07/14 1204) Antibody: NEG (07/14 1204) Rubella: 8.72 (07/14 1204) RPR: NON REAC (10/28 1129)  HBsAg: NEGATIVE (07/14 1204)  HIV: NONREACTIVE (10/28 1129)  GBS:     Assessment/Plan: 24 yo female G2P1001 at 3982w0d here for labor.  # labor- expectant management  # pain management- desires IV pain meds  # Contraception- nexplanon  # breastfeeding  [name], Amber 01/03/2015, 11:17 AM    OB fellow attestation:  I have seen and examined this patient; I agree with above documentation in the resident's note.   Lori Cardenas is a 24 y.o. G2P1001 here for direct admit from clinic  PE: BP 113/80 mmHg  Pulse 104  Temp(Src) 97.8 F (36.6 C) (Oral)  Resp 20  Ht 5' (1.524 m)  Wt 127 lb (57.607 kg)  BMI 24.80 kg/m2  LMP 03/04/2014 Gen: calm comfortable, NAD Resp: normal effort, no distress Abd: gravid  ROS, labs, PMH reviewed  Plan: See plan above  Shaela Boer ROCIO 01/03/2015, 12:31 PM

## 2015-01-03 NOTE — MAU Note (Signed)
uc's since 0730, small amount of bleeding, no LOF.

## 2015-01-04 ENCOUNTER — Encounter (HOSPITAL_COMMUNITY): Payer: Self-pay | Admitting: *Deleted

## 2015-01-04 LAB — CBC
HEMATOCRIT: 29 % — AB (ref 36.0–46.0)
Hemoglobin: 10.3 g/dL — ABNORMAL LOW (ref 12.0–15.0)
MCH: 31.4 pg (ref 26.0–34.0)
MCHC: 35.5 g/dL (ref 30.0–36.0)
MCV: 88.4 fL (ref 78.0–100.0)
Platelets: 155 10*3/uL (ref 150–400)
RBC: 3.28 MIL/uL — AB (ref 3.87–5.11)
RDW: 13.6 % (ref 11.5–15.5)
WBC: 12.3 10*3/uL — AB (ref 4.0–10.5)

## 2015-01-04 LAB — HIV ANTIBODY (ROUTINE TESTING W REFLEX)
HIV 1/HIV 2 AB: NONREACTIVE
HIV 1/O/2 Abs-Index Value: <1 (ref <1.00)

## 2015-01-04 LAB — RPR: RPR Ser Ql: NONREACTIVE

## 2015-01-04 MED ORDER — OXYCODONE-ACETAMINOPHEN 5-325 MG PO TABS
1.0000 | ORAL_TABLET | ORAL | Status: DC | PRN
  Administered 2015-01-04 (×2): 1 via ORAL
  Filled 2015-01-04 (×2): qty 1

## 2015-01-04 MED ORDER — IBUPROFEN 600 MG PO TABS: 600.0000 mg | ORAL_TABLET | Freq: Four times a day (QID) | ORAL | Status: DC | PRN

## 2015-01-04 NOTE — Progress Notes (Signed)
Ur chart review completed.  

## 2015-01-04 NOTE — Discharge Summary (Signed)
24 yo G2P2002 at 1138w0d without complications of pregnancy delivered healthy baby girl via vacuum assisted vaginal delivery. Pt had some bleeding after placental delivery, so methergine was given. Bleeding resolved soon after delivery. Pt doing well on PPD#1.  Obstetric Discharge Summary Reason for Admission: onset of labor Prenatal Procedures: none Intrapartum Procedures: vacuum Postpartum Procedures: none Complications-Operative and Postpartum: none HEMOGLOBIN  Date Value Ref Range Status  01/04/2015 10.3* 12.0 - 15.0 g/dL Final    Comment:    DELTA CHECK NOTED REPEATED TO VERIFY    HCT  Date Value Ref Range Status  01/04/2015 29.0* 36.0 - 46.0 % Final    Physical Exam:  See progress note   Discharge Diagnoses: Term Pregnancy-delivered  Discharge Information: Date: 01/04/2015 Activity: unrestricted Diet: routine Medications: None Condition: stable Instructions: see attachment Discharge to: home Follow-up Information    Follow up with [facility] In 6 weeks.   Contact information:   [address] [place] [id number]       Newborn Data: Live born female  Birth Weight: 6 lb 3.1 oz (2809 g) APGAR: 7, 9  Home with mother.  [name], Amber 01/04/2015, 5:02 PM   I examined pt and agree with documentation above and resident plan of care. Eino FarberWalidah Kennith GainN Karim, CNM

## 2015-01-04 NOTE — Lactation Note (Signed)
This note was copied from the chart of Lori Cardenas. Lactation Consultation Note  Patient Name: Lori Simonne Comeh Mocarski ZOXWR'UToday's Date: 01/04/2015 Reason for consult: Initial assessment Baby 23 hours of life. Baby just finishing up nursing [facility]. Mom reports hearing swallows throughout nursing. Mom denies nipple pain, and reports baby is nursing 15-25 minutes each time. [facility] given [facility] broch[facility], aware of community resources, and [facility] phone line assistance after D/C. Reviewed engorgement prevention/treatment and referred mom to Baby and Me booklet for number of diapers to expect by day of life and EBM storage guidelines.   Maternal Data Formula Feeding for Exclusion: No Has patient been taught Hand Expression?: Yes Does the patient have breastfeeding experience prior to this delivery?: Yes  Feeding Feeding Type: Breast Fed Length of feed: 20 min  LATCH Score/Interventions Latch: Grasps breast easily, tongue down, lips flanged, rhythmical sucking.  Audible Swallowing: A few with stimulation  Type of Nipple: Everted at rest and after stimulation  Comfort (Breast/Nipple): Soft / non-tender     Hold (Positioning): No assistance needed to correctly position infant at breast.  LATCH Score: 9  Lactation Tools Discussed/Used     Consult Status Consult Status: PRN    Geralynn OchsWILLIARD, Kimbella Heisler 01/04/2015, 2:37 PM

## 2015-01-04 NOTE — Progress Notes (Signed)
Post Partum Day 1 Subjective: no complaints, up ad lib, voiding, tolerating PO and + flatus  Objective: Blood pressure 93/58, pulse 78, temperature 98.4 F (36.9 C), temperature source Oral, resp. rate 18, height 5' (1.524 m), weight 57.607 kg (127 lb), last menstrual period 03/04/2014, SpO2 99 %, unknown if currently breastfeeding.  Physical Exam:  General: alert, cooperative and no distress Lochia: appropriate Uterine Fundus: firm Incision: none DVT Evaluation: No evidence of DVT seen on physical exam.   Recent Labs  01/03/15 1121 01/04/15 0610  HGB 13.3 10.3*  HCT 37.1 29.0*    Assessment/Plan: Plan for discharge tomorrow, Breastfeeding and Contraception nexplanon   LOS: 1 day   [name], [place] 01/04/2015, 7:32 AM

## 2015-02-14 ENCOUNTER — Encounter: Payer: Self-pay | Admitting: Obstetrics & Gynecology

## 2015-02-14 ENCOUNTER — Ambulatory Visit (INDEPENDENT_AMBULATORY_CARE_PROVIDER_SITE_OTHER): Payer: Medicaid Other | Admitting: Obstetrics & Gynecology

## 2015-02-14 VITALS — BP 109/70 | HR 79 | Temp 99.0°F | Ht 59.0 in | Wt 113.7 lb

## 2015-02-14 DIAGNOSIS — Z30430 Encounter for insertion of intrauterine contraceptive device: Secondary | ICD-10-CM

## 2015-02-14 DIAGNOSIS — Z30018 Encounter for initial prescription of other contraceptives: Secondary | ICD-10-CM

## 2015-02-14 DIAGNOSIS — Z3202 Encounter for pregnancy test, result negative: Secondary | ICD-10-CM

## 2015-02-14 LAB — POCT PREGNANCY, URINE: PREG TEST UR: NEGATIVE

## 2015-02-14 MED ORDER — ETONOGESTREL 68 MG ~~LOC~~ IMPL
68.0000 mg | DRUG_IMPLANT | Freq: Once | SUBCUTANEOUS | Status: AC
  Administered 2015-02-14: 68 mg via SUBCUTANEOUS

## 2015-02-14 NOTE — Progress Notes (Signed)
Here for postpartum visit. Desires Nexplanon. States had intercourse for first time since delivery, but used condoms.

## 2015-02-14 NOTE — Patient Instructions (Signed)
Etonogestrel implant What is this medicine? ETONOGESTREL (et oh noe JES trel) is a contraceptive (birth control) device. It is used to prevent pregnancy. It can be used for up to 3 years. This medicine may be used for other purposes; ask your health care provider or pharmacist if you have questions. COMMON BRAND NAME(S): Implanon, Nexplanon What should I tell my health care provider before I take this medicine? They need to know if you have any of these conditions: -abnormal vaginal bleeding -blood vessel disease or blood clots -cancer of the breast, cervix, or liver -depression -diabetes -gallbladder disease -headaches -heart disease or recent heart attack -high blood pressure -high cholesterol -kidney disease -liver disease -renal disease -seizures -tobacco smoker -an unusual or allergic reaction to etonogestrel, other hormones, anesthetics or antiseptics, medicines, foods, dyes, or preservatives -pregnant or trying to get pregnant -breast-feeding How should I use this medicine? This device is inserted just under the skin on the inner side of your upper arm by a health care professional. Talk to your pediatrician regarding the use of this medicine in children. Special care may be needed. Overdosage: If you think you've taken too much of this medicine contact a poison control center or emergency room at once. Overdosage: If you think you have taken too much of this medicine contact a poison control center or emergency room at once. NOTE: This medicine is only for you. Do not share this medicine with others. What if I miss a dose? This does not apply. What may interact with this medicine? Do not take this medicine with any of the following medications: -amprenavir -bosentan -fosamprenavir This medicine may also interact with the following medications: -barbiturate medicines for inducing sleep or treating seizures -certain medicines for fungal infections like ketoconazole and  itraconazole -griseofulvin -medicines to treat seizures like carbamazepine, felbamate, oxcarbazepine, phenytoin, topiramate -modafinil -phenylbutazone -rifampin -some medicines to treat HIV infection like atazanavir, indinavir, lopinavir, nelfinavir, tipranavir, ritonavir -St. John's wort This list may not describe all possible interactions. Give your health care provider a list of all the medicines, herbs, non-prescription drugs, or dietary supplements you use. Also tell them if you smoke, drink alcohol, or use illegal drugs. Some items may interact with your medicine. What should I watch for while using this medicine? This product does not protect you against HIV infection (AIDS) or other sexually transmitted diseases. You should be able to feel the implant by pressing your fingertips over the skin where it was inserted. Tell your doctor if you cannot feel the implant. What side effects may I notice from receiving this medicine? Side effects that you should report to your doctor or health care professional as soon as possible: -allergic reactions like skin rash, itching or hives, swelling of the face, lips, or tongue -breast lumps -changes in vision -confusion, trouble speaking or understanding -dark urine -depressed mood -general ill feeling or flu-like symptoms -light-colored stools -loss of appetite, nausea -right upper belly pain -severe headaches -severe pain, swelling, or tenderness in the abdomen -shortness of breath, chest pain, swelling in a leg -signs of pregnancy -sudden numbness or weakness of the face, arm or leg -trouble walking, dizziness, loss of balance or coordination -unusual vaginal bleeding, discharge -unusually weak or tired -yellowing of the eyes or skin Side effects that usually do not require medical attention (Report these to your doctor or health care professional if they continue or are bothersome.): -acne -breast pain -changes in  weight -cough -fever or chills -headache -irregular menstrual bleeding -itching, burning, and   vaginal discharge -pain or difficulty passing urine -sore throat This list may not describe all possible side effects. Call your doctor for medical advice about side effects. You may report side effects to FDA at 1-800-FDA-1088. Where should I keep my medicine? This drug is given in a hospital or clinic and will not be stored at home. NOTE: This sheet is a summary. It may not cover all possible information. If you have questions about this medicine, talk to your doctor, pharmacist, or health care provider.  2015, Elsevier/Gold Standard. (2012-06-07 15:37:45)  

## 2015-02-14 NOTE — Progress Notes (Signed)
Patient ID: Lori Cardenas, female   DOB: 10/21/1991, 24 y.o.   MRN: [id number] Subjective:     Lori Cardenas is a 24 y.o. female who presents for a postpartum visit. She is 6 weeks postpartum following a spontaneous vaginal delivery. I have fully reviewed the prenatal and intrapartum course. The delivery was at 40 gestational weeks. Outcome: vacuum, outlet. Anesthesia: none. Postpartum course has been unremarkable. Baby's course has been uncomlicated. Baby is feeding by breast and bottle.. Bleeding no bleeding. Bowel function is normal. Bladder function is normal. Patient is sexually active. Contraception method is condoms. Pt reports being sexually active once last night with condoms.  She reports that the condom was intact upon completion. Postpartum depression screening: negative.  The following portions of the patient's history were reviewed and updated as appropriate: allergies, current medications, past family history, past medical history, past social history, past surgical history and problem list.  Review of Systems A comprehensive review of systems was negative.   Objective:    BP 109/70 mmHg  Pulse 79  Temp(Src) 99 F (37.2 C)  Ht 4\' 11"  (1.499 m)  Wt 113 lb 11.2 oz (51.574 kg)  BMI 22.95 kg/m2  Breastfeeding? Yes  Patient given informed consent, she signed consent form. Pregnancy test was negative.  Appropriate time out taken.  Patient's left arm was prepped and draped in the usual sterile fashion.. The ruler used to measure and mark insertion area.  Patient was prepped with alcohol swab and then injected with 5 ml of 1 % lidocaine.  She was prepped with betadine, Nexplanon removed from packaging,  Device confirmed in needle, then inserted full length of needle and withdrawn per handbook instructions.  There was minimal blood loss.  Patient insertion site covered with guaze and a pressure bandage to reduce any bruising.    Assessment:     6 weeks postpartum exam. Pap smear not done at  today's visit.   Plan:    1. Contraception: Nexplanon 2.  The patient tolerated the procedure well and was given post procedure instructions. Return in about one month for Nexplanon check prn complaints or problems. 3. Follow up in: 1 year or as needed.

## 2015-02-19 ENCOUNTER — Encounter: Payer: Self-pay | Admitting: *Deleted

## 2015-11-19 ENCOUNTER — Ambulatory Visit (INDEPENDENT_AMBULATORY_CARE_PROVIDER_SITE_OTHER): Payer: Self-pay | Admitting: Internal Medicine

## 2015-11-19 ENCOUNTER — Encounter: Payer: Self-pay | Admitting: Internal Medicine

## 2015-11-19 VITALS — BP 118/76 | HR 74 | Ht 60.0 in | Wt 119.0 lb

## 2015-11-19 DIAGNOSIS — K029 Dental caries, unspecified: Secondary | ICD-10-CM

## 2015-11-19 DIAGNOSIS — H8112 Benign paroxysmal vertigo, left ear: Secondary | ICD-10-CM

## 2015-11-19 DIAGNOSIS — T148XXA Other injury of unspecified body region, initial encounter: Secondary | ICD-10-CM

## 2015-11-19 DIAGNOSIS — T148 Other injury of unspecified body region: Secondary | ICD-10-CM

## 2015-11-19 NOTE — Progress Notes (Signed)
   Subjective:    Patient ID: Lori Cardenas, female    DOB: 06/19/1991, 24 y.o.   MRN: [id number]  HPI  1.  Right Should Pain:  24 hours.  Went to visit family 5 hours away 2 days ago.  Was putting the shoulder seat belt on her little sister and felt sudden pain.  Points to left trap midway to shoulder as source of pain.  No limitation of movement or use.  Has not tried anything for the pain.  2.  Left sided headaches:  Had what sounds like Implanon placed in left arm 8 months ago.  Headaches started within a month after Implanon placement.  States the wholed left side of her head is bothersome.  Denies what she feels is pain, she feels more like she is tired and is dizzy, like she does not have good balance.  States had more difficulties with this early on and seems to be diminishing now.  Really, just standing up makes her feel dizzy.  Has not noted turning her head one way or the other seems to make a difference with the dizziness.  No associated nausea or vomiting.  No photophobia or phonophobia.  Does not recall any URI symptoms before this started.  Did not have these symptoms before the IMplanon.  Has never fallen  Meds:  None  Allergies:  NKDA    Review of Systems     Objective:   Physical Exam HEENT:  PERRL, EOMI, Discs sharp, TMs pearly gray, nasal mucosa without swelling.  Throat without injection.  Dental decay-diffuse Neck:  Supple, no adenopathy, no thyromegaly Chest:  CTA CV:  RRR with normal S1 and S2, No S3, S4, or murmur.  Radial and DP pulses normal and equal.   Right shoulder:  Tender over trapezius midway to shoulder from neck.  No loss of muscle mass, no swelling.  Full ROM of neck and shoulder. Neuro:  A & O x 3, CN II-XII grossly intact, DTRs 2+/4 throughout, Motor is 5/5 throughout, sensory grossly normal.  No nystagmus.  Rapid alternating motions and Finger to nose to finger normal bilaterally.  Romberg negative.  Gait normal.  With head turned left in [facility] maneuver,  has vertiginous symptoms only when sits back up.  None on right.        Assessment & Plan:  1.  Right Trapezius Muscle Strain:  Cold compresses as needed as well as Ibuprofen.  To call if continues more than another month.  2.  LIkely BPV, left sided.  Expect to gradually improve.  Discussed repeating [facility] P[facility] maneuver as needed.  3.  Dental Decay: Dental referral.

## 2015-11-19 NOTE — Patient Instructions (Addendum)
Can google "advance directives, [place]"  And bring up form from [place]. Print and fill out Or can go to "5 wishes"  Which is also in Spanish and fill out--this costs $5--perhaps easier to use. Designate a CytogeneticistMedical Power of Attorney to speak for you if you are unable to speak for yourself when ill or injured  Ibuprofen 200 mg tabs, 2-4 tabs every 6 hours as needed for shoulder pain.  Always take with food. Cold compress to shoulder for 20 minutes as needed

## 2016-02-13 NOTE — Addendum Note (Signed)
Addended by: Marcene Duos on: 02/13/2016 03:25 PM   Modules accepted: Kipp Brood

## 2016-06-24 IMAGING — US US OB COMP LESS 14 WK
1 series · 14 of 28 positions shown · non-contrast
Comparison: None.

CLINICAL DATA: Dating pregnancy.  Unknown last LMP.

EXAM:
OBSTETRIC <14 WK ULTRASOUND
TECHNIQUE: Transabdominal ultrasound was performed for evaluation of the
gestation as well as the maternal uterus and adnexal regions.

[Series 1: us ob comp less 14 wks · 14 of 29 slices shown]
[im 2/29]
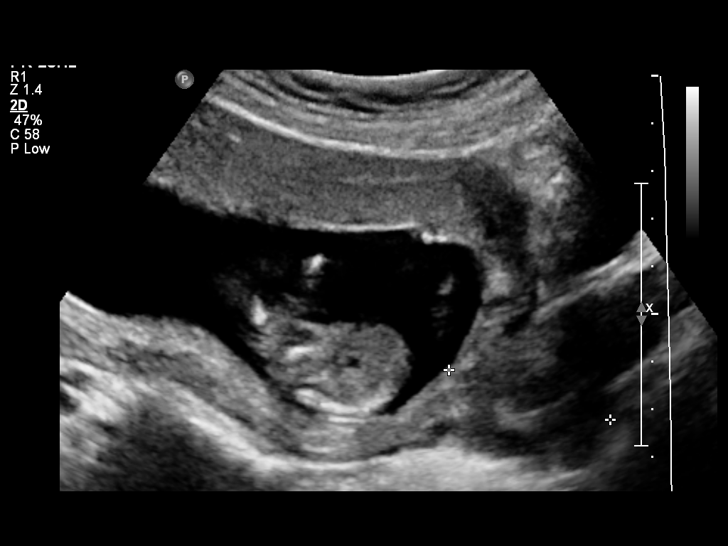
[im 4/29]
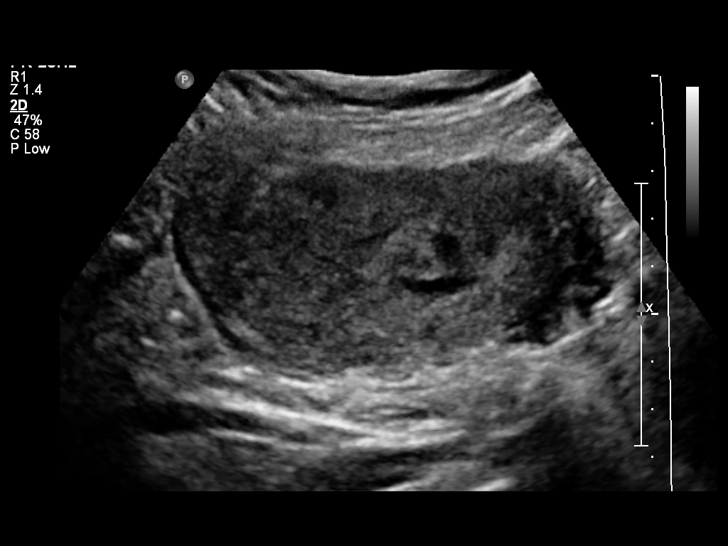
[im 6/29]
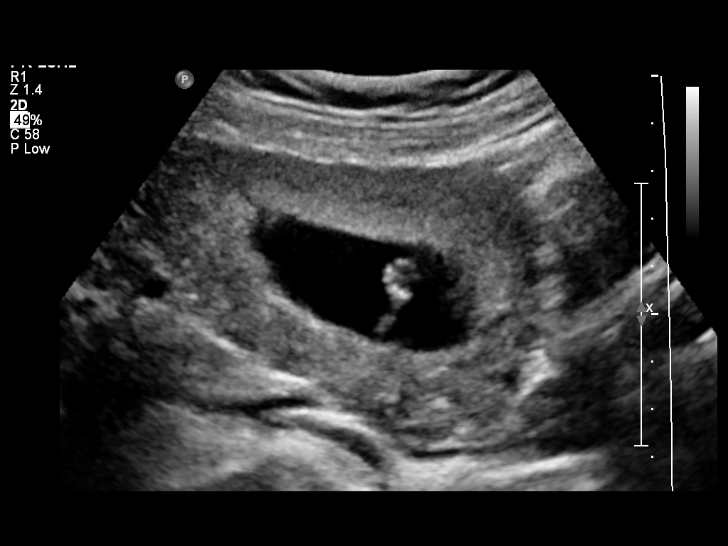
[im 8/29]
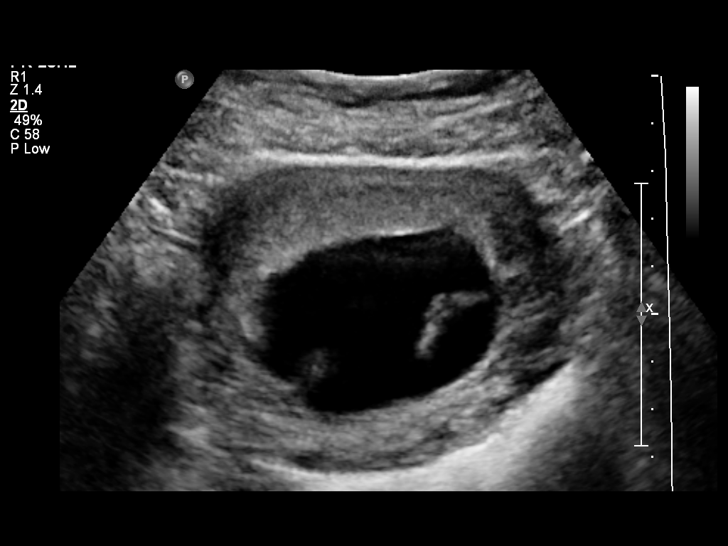
[im 10/29]
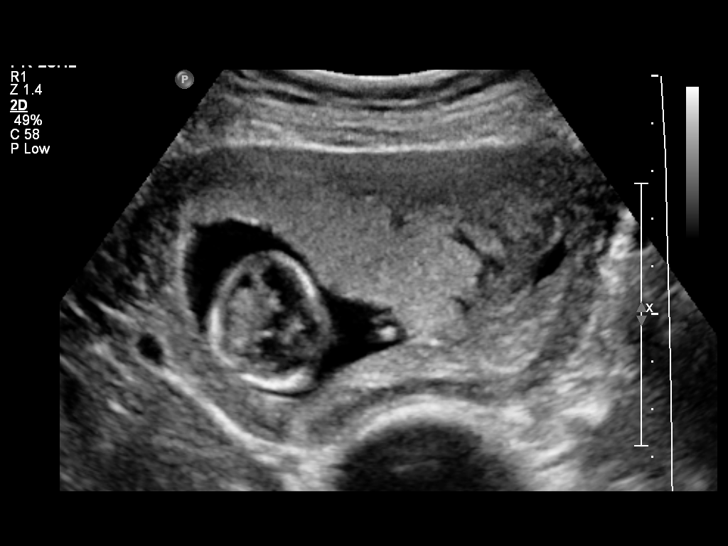
[im 12/29]
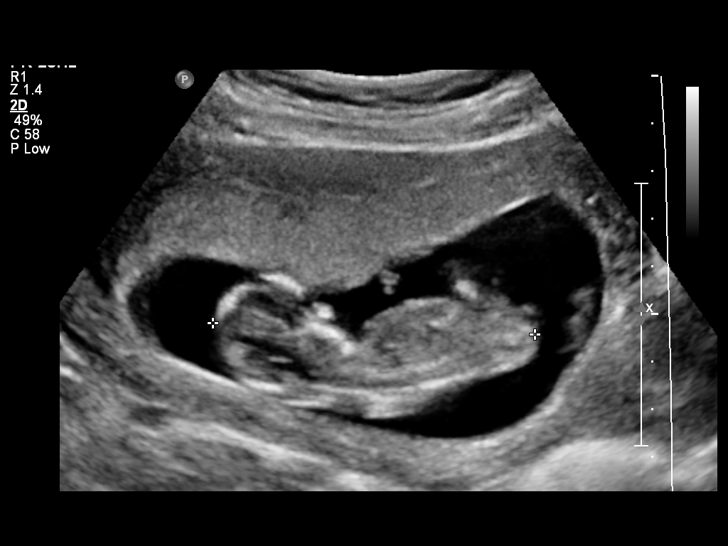
[im 14/29]
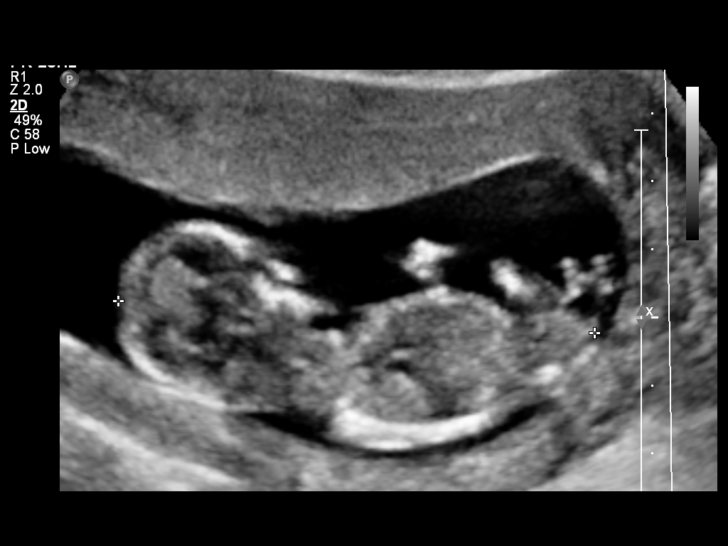
[im 16/29]
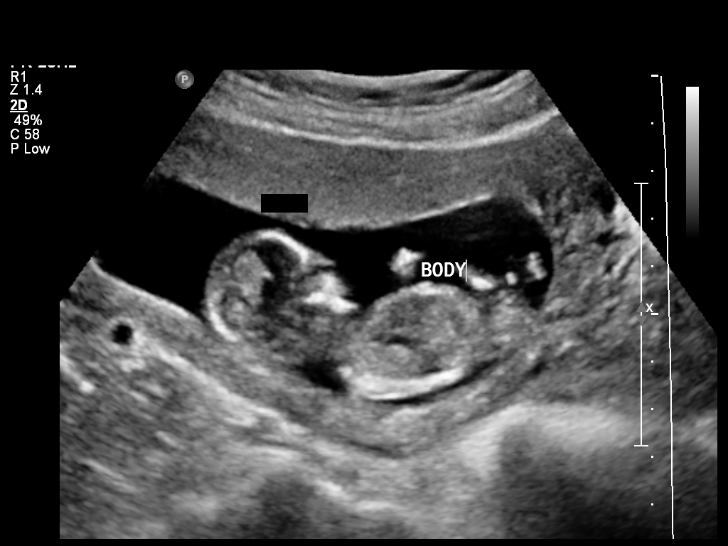
[im 18/29]
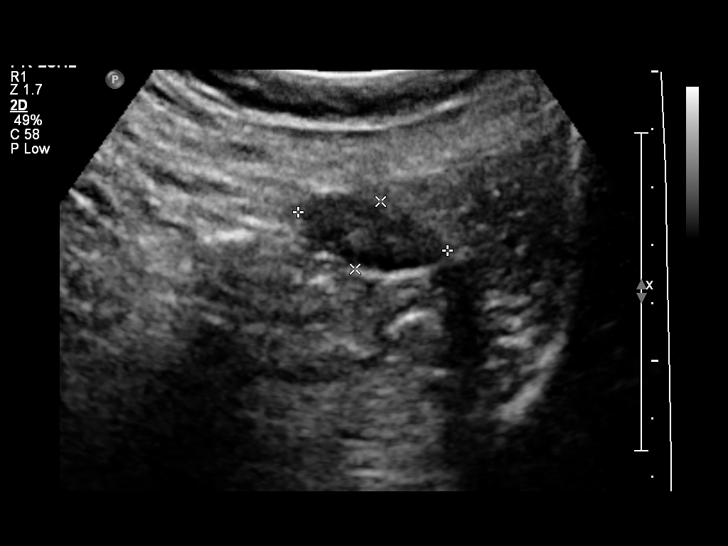
[im 20/29]
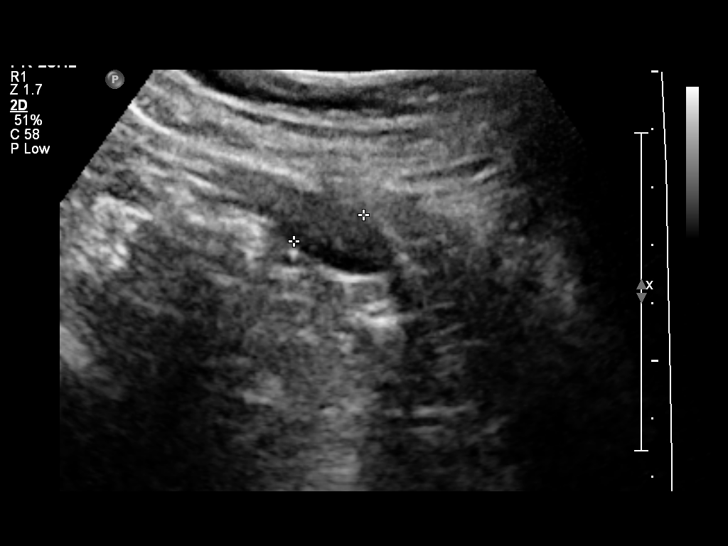
[im 22/29]
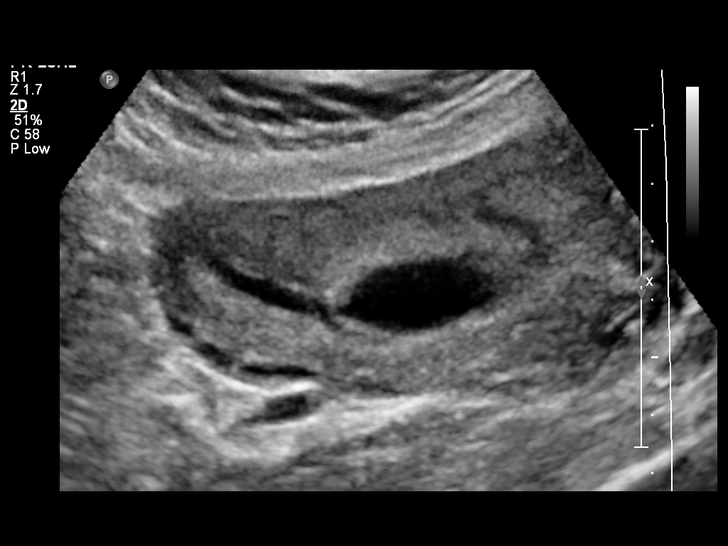
[im 24/29]
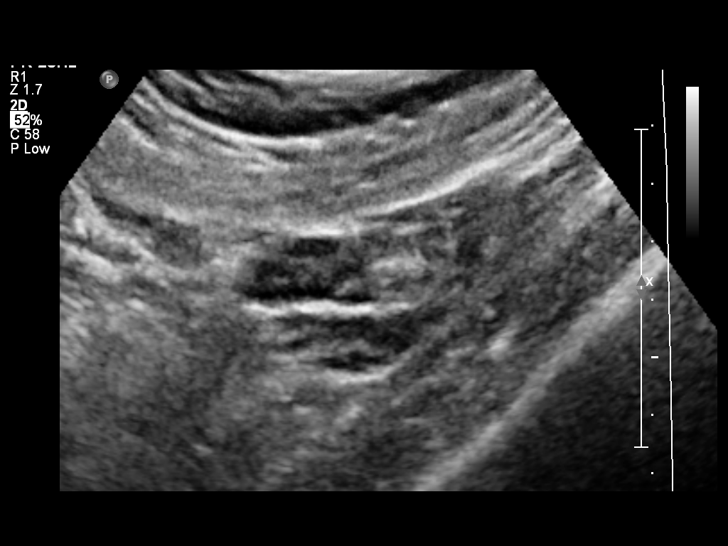
[im 26/29]
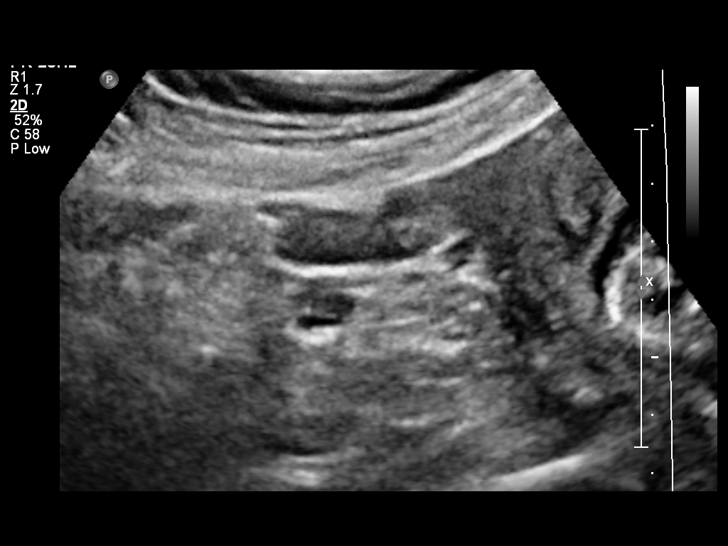
[im 29/29]
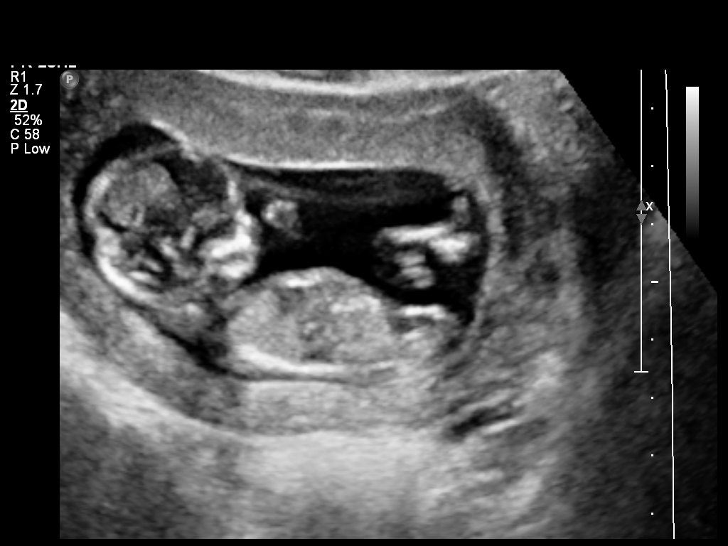

[14 of 28 positions shown; findings below may reference images not displayed]

FINDINGS: Intrauterine gestational sac: Visualized/normal in shape.

Yolk sac:  Not seen.

Embryo:  Present.

Cardiac Activity: Present.

Heart Rate: 167 bpm

CRL:   69.3  mm   13 w 2 d                  US EDC: 01/03/2015

Maternal uterus/adnexae: Small amount of subchorionic hemorrhage was
noted superiorly. Ovaries were unremarkable. No free fluid was
identified in the pelvis.
IMPRESSION: Single live intrauterine pregnancy with gestational age 13 weeks 2
days by ultrasound.

## 2016-10-17 ENCOUNTER — Ambulatory Visit: Payer: Medicaid Other | Admitting: Obstetrics and Gynecology

## 2016-12-02 ENCOUNTER — Encounter: Payer: Self-pay | Admitting: Internal Medicine

## 2016-12-02 ENCOUNTER — Ambulatory Visit (INDEPENDENT_AMBULATORY_CARE_PROVIDER_SITE_OTHER): Payer: Self-pay | Admitting: Internal Medicine

## 2016-12-02 VITALS — BP 110/70 | HR 74 | Resp 12 | Ht 59.0 in | Wt 121.0 lb

## 2016-12-02 DIAGNOSIS — N3000 Acute cystitis without hematuria: Secondary | ICD-10-CM

## 2016-12-02 LAB — POCT URINALYSIS DIPSTICK
Bilirubin, UA: NEGATIVE
Blood, UA: NEGATIVE
Glucose, UA: NEGATIVE
Ketones, UA: NEGATIVE
NITRITE UA: NEGATIVE
PH UA: 7
Protein, UA: NEGATIVE
Spec Grav, UA: <=1.005
UROBILINOGEN UA: 0.2

## 2016-12-02 MED ORDER — SULFAMETHOXAZOLE-TRIMETHOPRIM 800-160 MG PO TABS: 1.0000 | ORAL_TABLET | Freq: Two times a day (BID) | ORAL | 0 refills | Status: DC

## 2016-12-02 NOTE — Patient Instructions (Signed)
Sign [facility] particularly involving December 15th

## 2016-12-02 NOTE — Progress Notes (Signed)
   Subjective:    Patient ID: Lori Cardenas, female    DOB: 03/08/1991, 25 y.o.   MRN: [id number]  HPI   [facility] of white discharge with itching and burning on the outside more so with urination for 2-3 days.   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  Was started on Bactrim DS twice daily for for UTI and Fluconazole 150 mg once daily for yeast vaginitis.   Feels much better than the first day.  Still with a bit of pain in left lower quadrant and left low back, but pain is definitely better.  She was having pain in RLQ as well, but that is gone.  Vaginal itching is much better.  She is to go back after Christmas to have her Nexplanon removed.  Current Meds  Medication Sig  . [DISCONTINUED] fluconazole (DIFLUCAN) 150 MG tablet Take 150 mg by mouth daily. 1 by mouth on days 1,4 and 7  . [DISCONTINUED] sulfamethoxazole-trimethoprim (BACTRIM DS,SEPTRA DS) 800-160 MG tablet Take 1 tablet by mouth 2 (two) times daily.  . [DISCONTINUED] sulfamethoxazole-trimethoprim (BACTRIM DS,SEPTRA DS) 800-160 MG tablet Take 1 tablet by mouth 2 (two) times daily. For 7 more days (Patient not taking: Reported on 12/26/2016)   No Known Allergies      Review of Systems     Objective:   Physical Exam   NAD Lungs:  CTA CV:  RRR without murmur or rub Abd:  Moderate tenderness LLQ and low left flank. No HSM or mass, + BS       Assessment & Plan:  UTI:  Extending treatment of UTI with current symptoms.  Not clear if mild involvement of upper tract.  Treat for total of 10 days.  Push fluids Send for records from PHD to clarify if diagnosis of HSV vs HPV or a misunderstanding. To call if does not completely resolve.

## 2016-12-15 NOTE — L&D Delivery Note (Signed)
Delivery Note At 12:26 PM a viable female was delivered via Vaginal, Spontaneous Delivery (Presentation:ROA). Over intact perineum. APGAR:8/9 Placenta status: intact, discarded. 3VC, wrapped on foot x1, delayed cord clamping.  Mec at delivery.   Anesthesia:  IV pain meds Episiotomy: None Lacerations: None Est. Blood Loss (mL): 100 Fundus firm. Mom to postpartum.  Baby to Couplet care / Skin to Skin.  Kathlene November, SNM 09/27/2017, 12:36 PM

## 2016-12-26 ENCOUNTER — Ambulatory Visit (INDEPENDENT_AMBULATORY_CARE_PROVIDER_SITE_OTHER): Payer: Self-pay | Admitting: Internal Medicine

## 2016-12-26 ENCOUNTER — Encounter: Payer: Self-pay | Admitting: Internal Medicine

## 2016-12-26 VITALS — BP 118/80 | HR 82 | Temp 98.1°F | Resp 12 | Ht 60.0 in | Wt 121.0 lb

## 2016-12-26 DIAGNOSIS — R5383 Other fatigue: Secondary | ICD-10-CM

## 2016-12-26 DIAGNOSIS — R300 Dysuria: Secondary | ICD-10-CM

## 2016-12-26 DIAGNOSIS — R5381 Other malaise: Secondary | ICD-10-CM

## 2016-12-26 DIAGNOSIS — R51 Headache: Secondary | ICD-10-CM

## 2016-12-26 DIAGNOSIS — R519 Headache, unspecified: Secondary | ICD-10-CM

## 2016-12-26 LAB — POCT WET PREP WITH KOH
CLUE CELLS WET PREP PER HPF POC: NEGATIVE
KOH Prep POC: NEGATIVE
RBC Wet Prep HPF POC: NEGATIVE
TRICHOMONAS UA: NEGATIVE
Yeast Wet Prep HPF POC: NEGATIVE

## 2016-12-26 LAB — POCT URINE PREGNANCY: Preg Test, Ur: NEGATIVE

## 2016-12-26 MED ORDER — NITROFURANTOIN MONOHYD MACRO 100 MG PO CAPS: 100.0000 mg | ORAL_CAPSULE | Freq: Two times a day (BID) | ORAL | 0 refills | Status: DC

## 2016-12-26 NOTE — Progress Notes (Signed)
Subjective:    Patient ID: Lori Cardenas, female    DOB: 11/09/1991, 26 y.o.   MRN: [id number]  HPI  1.  Problems with periods: Did not like Nexplanon, so was placed on Sprintec 28 day for a month and then Nexplanon removed on Nov. 29th.  Had a period in November. Had what she felt was a normal period from December 20-25th, but then had spotting and cramping with back pain on Jan. 3rd, so stopped the pills.  Spotting and cramping stopped after 2 days. She is not sure what where she was with her monthly pack of pills at either time she had bleeding.  Not even clear if this was one or two separate packs. States her health is not good and she has decided she needs to get pregnant now if she wants another child.  2.  Moods Swings:  2-3 weeks:  States her husband and uncle have been telling her she is irritable.  Her girlfriends have not noted this.  Little patience with males in family and children  See hormone manipulation over past 2 months as above. Also not sleeping well for past month.  Goes to sleep at 8-9 p.m. And getting up at 7 a.m.  Keeps waking throughout night.    Last 2 weeks, feels nauseated, bloated, smells bother her.  Is hungry, but unable to eat much once she starts.  Had one episode of vomiting--only liquid:  2 days ago.  No diarrhea. No fever.  Maybe a little bit of cough and epigastric pain.  Is having acid taste in mouth and burning in epigastrium in past 2 days.  No melena or hematochezia.  Itchy watery eyes and nose, sneezing.  Bifrontal headache constantly--pounding for past 2 weeks.  Possible photophobia, phonophobia, no aura--always with headache. Hard to think, forgetful. Cannot say anything is really causing stress.  Vaginal burning again similar to what she had one month ago with PHD and treated for yeast.  Some dysuria and frequency.  All of these symptoms other than moodiness and cycle started at the same time about 2 weeks ago.  No outpatient prescriptions have been  marked as taking for the 12/26/16 encounter (Office Visit) with Julieanne MansonElizabeth Genecis Veley, MD.    No Known Allergies          Review of Systems     Objective:   Physical Exam  Appears fatigued and uncomfortable.  Not toxic appearing HEENT:  Significant acne involvement of face.  PERRL, EOMI, discs sharp, but does not tolerate light of ophthalmoscope well, TMs pearly gray, throat without injection. Neck:  Supple, no adenopathy, no thyromegaly Chest:  CTA CV:  RRR withoout murmur or rub, radial and DP pulses normal and equal Abd;  S, + BS, No HSM or mass, tender left flank and suprapubic areas--mild Neuro:  A & O x 3, CN  II-XII grossly intact, DTRs 2+/4 throughout, motor 5/5 throughout, sensory grossly normal.       urine HCG:  negative Assessment & Plan:  1.  Possible UTI, if so, not clear if upper tract infection:  Macrobid 100 mg twice daily for 10 days Ibuprofen   2.  Headache:  Multifactorial:  Possibly related to UTI, stress related, hormone related or allergies.  Fexofenadine. Fexofenadine 180 mg once daily.  Ibuprofen as needed with food  3.  Hormone treatment for [facility] with moodiness and unusual bleeding pattern:  Discussed with the removal of Nexplanon and start of a new BCP, could be months  before her cycle evens out.  As she is not feeling well, would not recommend pregnancy until feeling well.  Would also recommend taking Folic Acid 1 mg daily before pregnancy. She does not tolerate PNVs with nausea and vomiting, headache.  4.  Malaise and Fatigue:  Urine HCG negative.  Possibly related to #1.  Check CBC, CMP, TSH as welll

## 2016-12-26 NOTE — Patient Instructions (Signed)
Ibuprofen 200 mg 2-4 tabs every 6 hours with crackers as needed for headache pain Fexofenadine 180 mg (Allegra) 1 tab once daily for itching of eyes, nose, sneezing

## 2016-12-27 ENCOUNTER — Encounter: Payer: Self-pay | Admitting: Internal Medicine

## 2016-12-27 LAB — CBC WITH DIFFERENTIAL/PLATELET
BASOS ABS: 0 10*3/uL (ref 0.0–0.2)
BASOS: 1 %
EOS (ABSOLUTE): 0.1 10*3/uL (ref 0.0–0.4)
Eos: 1 %
HEMATOCRIT: 38.2 % (ref 34.0–46.6)
Hemoglobin: 13.2 g/dL (ref 11.1–15.9)
Immature Grans (Abs): 0 10*3/uL (ref 0.0–0.1)
Immature Granulocytes: 0 %
Lymphocytes Absolute: 2.6 10*3/uL (ref 0.7–3.1)
Lymphs: 34 %
MCH: 30.4 pg (ref 26.6–33.0)
MCHC: 34.6 g/dL (ref 31.5–35.7)
MCV: 88 fL (ref 79–97)
MONOS ABS: 0.4 10*3/uL (ref 0.1–0.9)
Monocytes: 6 %
NEUTROS ABS: 4.4 10*3/uL (ref 1.4–7.0)
Neutrophils: 58 %
Platelets: 269 10*3/uL (ref 150–379)
RBC: 4.34 x10E6/uL (ref 3.77–5.28)
RDW: 13.7 % (ref 12.3–15.4)
WBC: 7.5 10*3/uL (ref 3.4–10.8)

## 2016-12-27 LAB — COMPREHENSIVE METABOLIC PANEL
ALT: 15 IU/L (ref 0–32)
AST: 15 IU/L (ref 0–40)
Albumin/Globulin Ratio: 1.8 (ref 1.2–2.2)
Albumin: 4.9 g/dL (ref 3.5–5.5)
Alkaline Phosphatase: 97 IU/L (ref 39–117)
BILIRUBIN TOTAL: 0.2 mg/dL (ref 0.0–1.2)
BUN/Creatinine Ratio: 16 (ref 9–23)
BUN: 10 mg/dL (ref 6–20)
CO2: 25 mmol/L (ref 18–29)
Calcium: 9 mg/dL (ref 8.7–10.2)
Chloride: 99 mmol/L (ref 96–106)
Creatinine, Ser: 0.63 mg/dL (ref 0.57–1.00)
GFR calc Af Amer: 144 mL/min/{1.73_m2} (ref >59)
GFR, EST NON AFRICAN AMERICAN: 125 mL/min/{1.73_m2} (ref >59)
GLOBULIN, TOTAL: 2.7 g/dL (ref 1.5–4.5)
Glucose: 97 mg/dL (ref 65–99)
Potassium: 3.8 mmol/L (ref 3.5–5.2)
Sodium: 141 mmol/L (ref 134–144)
Total Protein: 7.6 g/dL (ref 6.0–8.5)

## 2016-12-27 LAB — TSH: TSH: 4.99 u[IU]/mL — ABNORMAL HIGH (ref 0.450–4.500)

## 2016-12-28 LAB — URINE CULTURE

## 2016-12-30 ENCOUNTER — Telehealth: Payer: Self-pay

## 2016-12-30 LAB — POCT URINALYSIS DIPSTICK
BILIRUBIN UA: NEGATIVE
Blood, UA: NEGATIVE
Glucose, UA: NEGATIVE
KETONES UA: NEGATIVE
NITRITE UA: NEGATIVE
PROTEIN UA: NEGATIVE
Spec Grav, UA: 1.015
Urobilinogen, UA: 0.2
pH, UA: 7

## 2016-12-30 NOTE — Telephone Encounter (Signed)
Called patient to see if she was feeling better after starting antibiotics. Patient states she is feeling better. Informed we are evaluating her thyroid seeing as her TSH level came back high. Patient understood. Per Dr. Delrae AlfredMulberry since patient is feeling better have her finish antibiotics. Patient verbalized understanding to finish antibiotics.

## 2016-12-30 NOTE — Progress Notes (Signed)
Spoke with [facility] and added on Free T4

## 2016-12-31 LAB — T4, FREE: Free T4: 1.28 ng/dL (ref 0.82–1.77)

## 2016-12-31 LAB — SPECIMEN STATUS REPORT

## 2017-01-02 NOTE — Progress Notes (Signed)
Spoke with patient. Lab results given. Patient scheduled appointment for 03/03/17 @ 11 am.

## 2017-01-19 ENCOUNTER — Ambulatory Visit (INDEPENDENT_AMBULATORY_CARE_PROVIDER_SITE_OTHER): Payer: Self-pay | Admitting: Internal Medicine

## 2017-01-19 ENCOUNTER — Encounter: Payer: Self-pay | Admitting: Internal Medicine

## 2017-01-19 VITALS — BP 112/72 | HR 88 | Resp 12 | Ht 60.0 in | Wt 123.0 lb

## 2017-01-19 DIAGNOSIS — N912 Amenorrhea, unspecified: Secondary | ICD-10-CM

## 2017-01-19 DIAGNOSIS — Z3A01 Less than 8 weeks gestation of pregnancy: Secondary | ICD-10-CM

## 2017-01-19 LAB — POCT URINE PREGNANCY: Preg Test, Ur: POSITIVE — AB

## 2017-01-19 MED ORDER — PRENATAL PLUS 27-1 MG PO TABS: 1.0000 | ORAL_TABLET | Freq: Every day | ORAL | 11 refills | Status: DC

## 2017-01-19 NOTE — Patient Instructions (Signed)
Call office if unable to tolerate prenatal vitamins.

## 2017-01-19 NOTE — Progress Notes (Signed)
   Subjective:    Patient ID: Lori Cardenas, female    DOB: 03/10/1991, 26 y.o.   MRN: [id number]  HPI   See previous note:  Patient was not feeling well in January and was on and off different birth control options, inplant removed in November, then on BCPs, which she stopped in first couple weeks of January.  Had negative urine HCG on Jan12th.  Has been checking urine pregnancy at home every 2 weeks, was negative until yesterday. Positive today in clinic as well. Not taking PNV  No outpatient prescriptions have been marked as taking for the 01/19/17 encounter (Office Visit) with Julieanne MansonElizabeth Namita Yearwood, MD.    No Known Allergies    Review of Systems     Objective:   Physical Exam  NAD Lungs:  CTA CV:  RRR without murmur or rub, radial pulses normal and equal Abd:  S, NT, No HSM or mass. + BS        Assessment & Plan:  Pregancy:  Prenatal Vitamins.  States she is feeling fine now.  Work up previously last month was unremarkable. To apply for Medicaid or Adopt a Mom

## 2017-03-03 ENCOUNTER — Other Ambulatory Visit: Payer: Self-pay

## 2017-03-17 ENCOUNTER — Ambulatory Visit: Payer: Self-pay

## 2017-03-17 ENCOUNTER — Ambulatory Visit (INDEPENDENT_AMBULATORY_CARE_PROVIDER_SITE_OTHER): Payer: Medicaid Other | Admitting: Obstetrics and Gynecology

## 2017-03-17 ENCOUNTER — Encounter: Payer: Self-pay | Admitting: Obstetrics and Gynecology

## 2017-03-17 ENCOUNTER — Other Ambulatory Visit (HOSPITAL_COMMUNITY)
Admission: RE | Admit: 2017-03-17 | Discharge: 2017-03-17 | Disposition: A | Discharge status: Home / Self Care | Payer: Medicaid Other | Source: Ambulatory Visit | Attending: Obstetrics and Gynecology | Admitting: Obstetrics and Gynecology

## 2017-03-17 VITALS — BP 92/70 | HR 87 | Wt 119.7 lb

## 2017-03-17 DIAGNOSIS — O3680X0 Pregnancy with inconclusive fetal viability, not applicable or unspecified: Secondary | ICD-10-CM | POA: Diagnosis not present

## 2017-03-17 DIAGNOSIS — Z3491 Encounter for supervision of normal pregnancy, unspecified, first trimester: Secondary | ICD-10-CM

## 2017-03-17 DIAGNOSIS — Z113 Encounter for screening for infections with a predominantly sexual mode of transmission: Secondary | ICD-10-CM

## 2017-03-17 DIAGNOSIS — O26891 Other specified pregnancy related conditions, first trimester: Secondary | ICD-10-CM | POA: Insufficient documentation

## 2017-03-17 DIAGNOSIS — Z3490 Encounter for supervision of normal pregnancy, unspecified, unspecified trimester: Secondary | ICD-10-CM | POA: Insufficient documentation

## 2017-03-17 DIAGNOSIS — R109 Unspecified abdominal pain: Secondary | ICD-10-CM | POA: Insufficient documentation

## 2017-03-17 DIAGNOSIS — Z3689 Encounter for other specified antenatal screening: Secondary | ICD-10-CM | POA: Diagnosis not present

## 2017-03-17 DIAGNOSIS — Z124 Encounter for screening for malignant neoplasm of cervix: Secondary | ICD-10-CM

## 2017-03-17 NOTE — Progress Notes (Signed)
Pt informed that the ultrasound is considered a limited OB ultrasound and is not intended to be a complete ultrasound exam.  Patient also informed that the ultrasound is not being completed with the intent of assessing for fetal or placental anomalies or any pelvic abnormalities.  Explained that the purpose of today's ultrasound is to assess for viability.  Patient acknowledges the purpose of the exam and the limitations of the study.    Single IUP visualized FHR = 158 bpm per M-mode  Lori Cardenas notified of results

## 2017-03-17 NOTE — Progress Notes (Signed)
   PRENATAL VISIT NOTE  Subjective:  Lori Cardenas is a 26 y.o. G3P2002 at [redacted]w[redacted]d being seen today for her first prenatal visit.  She is currently monitored for the following issues for this low-risk pregnancy and has Encounter for supervision of low-risk pregnancy; Encounter to determine fetal viability of pregnancy; and Abdominal pain in pregnancy, first trimester on her problem list.  Patient reports no complaints.  Contractions: Not present. Vag. Bleeding: None.  Movement: Present. Denies leaking of fluid.   The following portions of the patient's history were reviewed and updated as appropriate: allergies, current medications, past family history, past medical history, past social history, past surgical history and problem list. Problem list updated.  Objective:   Vitals:   03/17/17 1027  BP: 92/70  Pulse: 87  Weight: 119 lb 11.2 oz (54.3 kg)    Fetal Status:     Movement: Present     BP 92/70   Pulse 87   Wt 119 lb 11.2 oz (54.3 kg)   LMP 12/08/2016 (Approximate)   BMI 23.38 kg/m    Uterine Size: size equals dates  Pelvic Exam:    Perineum: No Hemorrhoids, Normal Perineum   Vulva: normal   Vagina:  normal mucosa, normal discharge, no palpable nodules   pH: Not done   Cervix: no bleeding following Pap, no cervical motion tenderness and no lesions   Adnexa: normal adnexa and no mass, fullness, tenderness   Bony Pelvis: Adequate   Skin: normal coloration and turgor, no rashes    Neurologic: negative   Extremities: normal strength, tone, and muscle mass   HEENT neck supple with midline trachea and thyroid without masses   Mouth/Teeth mucous membranes moist, pharynx normal without lesions   Neck supple and no masses   Cardiovascular: regular rate and rhythm, no murmurs or gallops   Respiratory:  appears well, vitals normal, no respiratory distress, acyanotic, normal RR, neck free of mass or lymphadenopathy, chest clear, no wheezing, crepitations, rhonchi, normal symmetric air  entry   Abdomen: soft, non-tender; bowel sounds normal; no masses,  no organomegaly   Urinary: urethral meatus normal   Assessment and Plan:  Pregnancy: G3P2002 at [redacted]w[redacted]d  1. Encounter for supervision of low-risk pregnancy in first trimester  - US OB Limited; Future, fetal heart tones noted with fetal movement.  - Cytology - PAP - Hemoglobinopathy Evaluation - Obstetric Panel, Including HIV - Culture, OB Urine  2. Encounter to determine fetal viability of pregnancy, single or unspecified fetus - Rn unable to doppler fetal  Hear tones.  - US OB Limited; Future  3. Abdominal pain in pregnancy, first trimester  - Urinalysis   Preterm labor symptoms and general obstetric precautions including but not limited to vaginal bleeding, contractions, leaking of fluid and fetal movement were reviewed in detail with the patient. Please refer to After Visit Summary for other counseling recommendations.   Initial labs drawn.  Quad screen favored, will order at next visit.  Prenatal vitamins. Problem list reviewed and updated. Genetic Screening discussed Quad Screen at next visit.  Follow up in 4 weeks. Discussed the nature of [place] [facility] with multiple MDs and other Advanced Practice Providers was explained to patient; also emphasized that residents, students are part of our team. Routine obstetric precautions reviewed.    Duane Lope, NP  [facility] for [employer], [facility]

## 2017-03-18 ENCOUNTER — Telehealth: Payer: Self-pay | Admitting: *Deleted

## 2017-03-18 NOTE — Telephone Encounter (Signed)
Left message yesterday stating that there was a prescription sent to her pharmacy after her visit and she has a question.

## 2017-03-18 NOTE — Telephone Encounter (Signed)
Returned patient's call. Her question was in regards to the medications listed on her AVS. She was confused over the inclusion of macrobid and wanted to know what it was for. After checking, I let her know that the macrobid was an old prescription from Feb and was prescribed at that time for UTI. I asked her if she had finished this medication and she said she had. She asked if her urine results were back yet. I informed her that her culture was still in progress and if any of her tests were abnormal we will call to let her know. Patient voiced understanding.

## 2017-03-19 LAB — URINALYSIS
Bilirubin, UA: NEGATIVE
Glucose, UA: NEGATIVE
Ketones, UA: NEGATIVE
Leukocytes, UA: NEGATIVE
NITRITE UA: NEGATIVE
SPEC GRAV UA: 1.022 (ref 1.005–1.030)
Urobilinogen, Ur: 0.2 mg/dL (ref 0.2–1.0)
pH, UA: 6.5 (ref 5.0–7.5)

## 2017-03-19 LAB — OBSTETRIC PANEL, INCLUDING HIV
Antibody Screen: NEGATIVE
BASOS ABS: 0 10*3/uL (ref 0.0–0.2)
Basos: 0 %
EOS (ABSOLUTE): 0 10*3/uL (ref 0.0–0.4)
Eos: 0 %
HEP B S AG: NEGATIVE
HIV Screen 4th Generation wRfx: NONREACTIVE
Hematocrit: 35.9 % (ref 34.0–46.6)
Hemoglobin: 12.5 g/dL (ref 11.1–15.9)
IMMATURE GRANS (ABS): 0 10*3/uL (ref 0.0–0.1)
IMMATURE GRANULOCYTES: 0 %
LYMPHS: 24 %
Lymphocytes Absolute: 2 10*3/uL (ref 0.7–3.1)
MCH: 30.1 pg (ref 26.6–33.0)
MCHC: 34.8 g/dL (ref 31.5–35.7)
MCV: 87 fL (ref 79–97)
MONOCYTES: 4 %
Monocytes Absolute: 0.3 10*3/uL (ref 0.1–0.9)
Neutrophils Absolute: 6.1 10*3/uL (ref 1.4–7.0)
Neutrophils: 72 %
Platelets: 216 10*3/uL (ref 150–379)
RBC: 4.15 x10E6/uL (ref 3.77–5.28)
RDW: 14.3 % (ref 12.3–15.4)
RH TYPE: POSITIVE
RPR Ser Ql: NONREACTIVE
RUBELLA: 13.1 {index} (ref >0.99)
WBC: 8.5 10*3/uL (ref 3.4–10.8)

## 2017-03-19 LAB — HEMOGLOBINOPATHY EVALUATION
FERRITIN: 67 ng/mL (ref 15–150)
HGB F QUANT: 0 % (ref 0.0–2.0)
HGB SOLUBILITY: NEGATIVE
HGB VARIANT: 0 %
Hgb A2 Quant: 2.4 % (ref 1.8–3.2)
Hgb A: 97.6 % (ref 96.4–98.8)
Hgb C: 0 %
Hgb S: 0 %

## 2017-03-20 LAB — CYTOLOGY - PAP
CHLAMYDIA, DNA PROBE: NEGATIVE
Diagnosis: NEGATIVE
Neisseria Gonorrhea: NEGATIVE

## 2017-03-23 LAB — URINE CULTURE, OB REFLEX

## 2017-03-23 LAB — CULTURE, OB URINE

## 2017-03-28 ENCOUNTER — Telehealth: Payer: Self-pay | Admitting: Obstetrics and Gynecology

## 2017-03-28 DIAGNOSIS — B951 Streptococcus, group B, as the cause of diseases classified elsewhere: Secondary | ICD-10-CM | POA: Insufficient documentation

## 2017-03-28 DIAGNOSIS — O2342 Unspecified infection of urinary tract in pregnancy, second trimester: Principal | ICD-10-CM

## 2017-03-28 DIAGNOSIS — O2340 Unspecified infection of urinary tract in pregnancy, unspecified trimester: Secondary | ICD-10-CM

## 2017-03-28 MED ORDER — AMOXICILLIN 500 MG PO CAPS: 500.0000 mg | ORAL_CAPSULE | Freq: Two times a day (BID) | ORAL | 0 refills | Status: DC

## 2017-03-28 NOTE — Telephone Encounter (Signed)
+   urine culture showing GBS Amoxicillin sent to the pharmacy.    Duane Lope, NP

## 2017-04-05 ENCOUNTER — Encounter: Payer: Self-pay | Admitting: Internal Medicine

## 2017-04-14 ENCOUNTER — Ambulatory Visit (INDEPENDENT_AMBULATORY_CARE_PROVIDER_SITE_OTHER): Payer: Medicaid Other | Admitting: Certified Nurse Midwife

## 2017-04-14 VITALS — BP 106/62 | HR 74 | Wt 121.6 lb

## 2017-04-14 DIAGNOSIS — Z3482 Encounter for supervision of other normal pregnancy, second trimester: Secondary | ICD-10-CM

## 2017-04-14 DIAGNOSIS — B951 Streptococcus, group B, as the cause of diseases classified elsewhere: Secondary | ICD-10-CM

## 2017-04-14 DIAGNOSIS — O2342 Unspecified infection of urinary tract in pregnancy, second trimester: Secondary | ICD-10-CM

## 2017-04-14 DIAGNOSIS — O219 Vomiting of pregnancy, unspecified: Secondary | ICD-10-CM

## 2017-04-14 DIAGNOSIS — Z3492 Encounter for supervision of normal pregnancy, unspecified, second trimester: Secondary | ICD-10-CM

## 2017-04-14 MED ORDER — VITAMIN B-6 100 MG PO TABS: 100.0000 mg | ORAL_TABLET | Freq: Every day | ORAL | 2 refills | Status: DC

## 2017-04-14 MED ORDER — RANITIDINE HCL 150 MG PO TABS: 150.0000 mg | ORAL_TABLET | Freq: Two times a day (BID) | ORAL | 1 refills | Status: DC

## 2017-04-14 MED ORDER — DOXYLAMINE SUCCINATE (SLEEP) 25 MG PO TABS: 25.0000 mg | ORAL_TABLET | Freq: Every evening | ORAL | 0 refills | Status: DC | PRN

## 2017-04-14 NOTE — Progress Notes (Signed)
Subjective:  Lori Cardenas is a 26 y.o. G3P2002 at [redacted]w[redacted]d being seen today for ongoing prenatal care.  She is currently monitored for the following issues for this low-risk pregnancy and has Encounter for supervision of low-risk pregnancy; Encounter to determine fetal viability of pregnancy; Abdominal pain in pregnancy, first trimester; GBS (group B streptococcus) UTI complicating pregnancy; and Nausea/vomiting in pregnancy on her problem list.  Patient reports backache, heartburn, nausea and vomiting.  Contractions: Not present. Vag. Bleeding: None.  Movement: Present. Denies leaking of fluid.  Reports daily N/V x1 month. Can tolerate some foods. No diarrhea.  The following portions of the patient's history were reviewed and updated as appropriate: allergies, current medications, past family history, past medical history, past social history, past surgical history and problem list. Problem list updated.  Objective:   Vitals:   04/14/17 0854  BP: 106/62  Pulse: 74  Weight: 121 lb 9.6 oz (55.2 kg)    Fetal Status: Fetal Heart Rate (bpm): 150 Fundal Height: 18 cm Movement: Present     General:  Alert, oriented and cooperative. Patient is in no acute distress.  Skin: Skin is warm and dry. No rash noted.   Cardiovascular: Normal heart rate noted  Respiratory: Normal respiratory effort, no problems with respiration noted  Abdomen: Soft, gravid, appropriate for gestational age. Pain/Pressure: Present     Pelvic: Vag. Bleeding: None     Cervical exam deferred        Extremities: Normal range of motion.  Edema: None  Mental Status: Normal mood and affect. Normal behavior. Normal judgment and thought content.   Urinalysis:      Assessment and Plan:  Pregnancy: G3P2002 at [redacted]w[redacted]d  1. Group B Streptococcus urinary tract infection affecting pregnancy in second trimester -finished abx a few days ago, TOC next visit  2. Encounter for supervision of low-risk pregnancy in second trimester - [place] [facility] OB  DETAIL +14 WK; Future - AFP, Quad Screen  3. Nausea/vomiting in pregnancy -Rx Unisom/B6 -Rx Zantac -HEG diet -adequate weight gain  Preterm labor symptoms and general obstetric precautions including but not limited to vaginal bleeding, contractions, leaking of fluid and fetal movement were reviewed in detail with the patient. Please refer to After Visit Summary for other counseling recommendations.  Return in about 4 weeks (around 05/12/2017).   Donette Larry, CNM

## 2017-04-14 NOTE — Patient Instructions (Signed)
Morning Sickness Morning sickness is when you feel sick to your stomach (nauseous) during pregnancy. You may feel sick to your stomach and throw up (vomit). You may feel sick in the morning, but you can feel this way any time of day. Some women feel very sick to their stomach and cannot stop throwing up (hyperemesis gravidarum). Follow these instructions at home:  Only take medicines as told by your doctor.  Take multivitamins as told by your doctor. Taking multivitamins before getting pregnant can stop or lessen the harshness of morning sickness.  Eat dry toast or unsalted crackers before getting out of bed.  Eat 5 to 6 small meals a day.  Eat dry and bland foods like rice and baked potatoes.  Do not drink liquids with meals. Drink between meals.  Do not eat greasy, fatty, or spicy foods.  Have someone cook for you if the smell of food causes you to feel sick or throw up.  If you feel sick to your stomach after taking prenatal vitamins, take them at night or with a snack.  Eat protein when you need a snack (nuts, yogurt, cheese).  Eat unsweetened gelatins for dessert.  Wear a bracelet used for sea sickness (acupressure wristband).  Go to a doctor that puts thin needles into certain body points (acupuncture) to improve how you feel.  Do not smoke.  Use a humidifier to keep the air in your house free of odors.  Get lots of fresh air. Contact a doctor if:  You need medicine to feel better.  You feel dizzy or lightheaded.  You are losing weight. Get help right away if:  You feel very sick to your stomach and cannot stop throwing up.  You pass out (faint). This information is not intended to replace advice given to you by your health care provider. Make sure you discuss any questions you have with your health care provider. Document Released: 01/08/2005 Document Revised: 05/08/2016 Document Reviewed: 05/18/2013 Elsevier Interactive Patient Education  2017 [employer] Back Pain in Pregnancy Back pain during pregnancy is common. Back pain may be caused by several factors that are related to changes during your pregnancy. Follow these instructions at home: Managing pain, stiffness, and swelling   If directed, apply ice for sudden (acute) back pain.  Put ice in a plastic bag.  Place a towel between your skin and the bag.  Leave the ice on for 20 minutes, 2-3 times per day.  If directed, apply heat to the affected area before you exercise:  Place a towel between your skin and the heat pack or heating pad.  Leave the heat on for 20-30 minutes.  Remove the heat if your skin turns bright red. This is especially important if you are unable to feel pain, heat, or cold. You may have a greater risk of getting burned. Activity   Exercise as told by your health care provider. Exercising is the best way to prevent or manage back pain.  Listen to your body when lifting. If lifting hurts, ask for help or bend your knees. This uses your leg muscles instead of your back muscles.  Squat down when picking up something from the floor. Do not bend over.  Only use bed rest as told by your health care provider. Bed rest should only be used for the most severe episodes of back pain. Standing, Sitting, and Lying Down   Do not stand in one place for long periods of time.  Use good posture  when sitting. Make sure your head rests over your shoulders and is not hanging forward. Use a pillow on your lower back if necessary.  Try sleeping on your side, preferably the left side, with a pillow or two between your legs. If you are sore after a night's rest, your bed may be too soft. A firm mattress may provide more support for your back during pregnancy. General instructions   Do not wear high heels.  Eat a healthy diet. Try to gain weight within your health care provider's recommendations.  Use a maternity girdle, elastic sling, or back brace as told by your health  care provider.  Take over-the-counter and prescription medicines only as told by your health care provider.  Keep all follow-up visits as told by your health care provider. This is important. This includes any visits with any specialists, such as a physical therapist. Contact a health care provider if:  Your back pain interferes with your daily activities.  You have increasing pain in other parts of your body. Get help right away if:  You develop numbness, tingling, weakness, or problems with the use of your arms or legs.  You develop severe back pain that is not controlled with medicine.  You have a sudden change in bowel or bladder control.  You develop shortness of breath, dizziness, or you faint.  You develop nausea, vomiting, or sweating.  You have back pain that is a rhythmic, cramping pain similar to labor pains. Labor pain is usually 1-2 minutes apart, lasts for about 1 minute, and involves a bearing down feeling or pressure in your pelvis.  You have back pain and your water breaks or you have vaginal bleeding.  You have back pain or numbness that travels down your leg.  Your back pain developed after you fell.  You develop pain on one side of your back.  You see blood in your urine.  You develop skin blisters in the area of your back pain. This information is not intended to replace advice given to you by your health care provider. Make sure you discuss any questions you have with your health care provider. Document Released: 03/11/2006 Document Revised: 05/08/2016 Document Reviewed: 08/15/2015 Elsevier Interactive Patient Education  2017 [employer].

## 2017-04-17 LAB — AFP, QUAD SCREEN
DIA MOM VALUE: 1.33
DIA VALUE (EIA): 257.04 pg/mL
DSR (By Age)    1 IN: 998
DSR (Second Trimester) 1 IN: 1474
Gestational Age: 18.1 WEEKS
MSAFP Mom: 1.2
MSAFP: 60.1 ng/mL
MSHCG MOM: 1.71
MSHCG: 53938 m[IU]/mL
Maternal Age At EDD: 25.7 yr
OSB RISK: 6499
T18 (By Age): 1:3887 {titer}
TEST RESULTS AFP: NEGATIVE
Weight: 121 [lb_av]
uE3 Mom: 0.69
uE3 Value: 0.96 ng/mL

## 2017-05-01 ENCOUNTER — Ambulatory Visit (HOSPITAL_COMMUNITY): Admission: RE | Admit: 2017-05-01 | Payer: Medicaid Other | Source: Ambulatory Visit

## 2017-05-12 ENCOUNTER — Ambulatory Visit (INDEPENDENT_AMBULATORY_CARE_PROVIDER_SITE_OTHER): Payer: Medicaid Other | Admitting: Advanced Practice Midwife

## 2017-05-12 VITALS — BP 117/60 | HR 84 | Wt 123.0 lb

## 2017-05-12 DIAGNOSIS — O2342 Unspecified infection of urinary tract in pregnancy, second trimester: Secondary | ICD-10-CM

## 2017-05-12 DIAGNOSIS — Z3492 Encounter for supervision of normal pregnancy, unspecified, second trimester: Secondary | ICD-10-CM

## 2017-05-12 DIAGNOSIS — Z363 Encounter for antenatal screening for malformations: Secondary | ICD-10-CM

## 2017-05-12 DIAGNOSIS — B951 Streptococcus, group B, as the cause of diseases classified elsewhere: Secondary | ICD-10-CM

## 2017-05-12 DIAGNOSIS — R8271 Bacteriuria: Secondary | ICD-10-CM

## 2017-05-12 DIAGNOSIS — Z3402 Encounter for supervision of normal first pregnancy, second trimester: Secondary | ICD-10-CM

## 2017-05-12 NOTE — Progress Notes (Signed)
   PRENATAL VISIT NOTE  Subjective:  Lori Cardenas is a 26 y.o. G3P2002 at 6056w1d being seen today for ongoing prenatal care.  She is currently monitored for the following issues for this low-risk pregnancy and has Encounter for supervision of low-risk pregnancy; Encounter to determine fetal viability of pregnancy; Abdominal pain in pregnancy, first trimester; GBS (group B streptococcus) UTI complicating pregnancy; and Nausea/vomiting in pregnancy on her problem list.  Patient reports heartburn.  Contractions: Not present. Vag. Bleeding: None.  Movement: Present. Denies leaking of fluid.   The following portions of the patient's history were reviewed and updated as appropriate: allergies, current medications, past family history, past medical history, past social history, past surgical history and problem list. Problem list updated.  Objective:   Vitals:   05/12/17 1129  BP: 117/60  Pulse: 84  Weight: 123 lb (55.8 kg)    Fetal Status: Fetal Heart Rate (bpm): 152 Fundal Height: 22 cm Movement: Present     General:  Alert, oriented and cooperative. Patient is in no acute distress.  Skin: Skin is warm and dry. No rash noted.   Cardiovascular: Normal heart rate noted  Respiratory: Normal respiratory effort, no problems with respiration noted  Abdomen: Soft, gravid, appropriate for gestational age. Pain/Pressure: Absent     Pelvic:  Cervical exam deferred        Extremities: Normal range of motion.  Edema: None  Mental Status: Normal mood and affect. Normal behavior. Normal judgment and thought content.   Assessment and Plan:  Pregnancy: G3P2002 at 4556w1d  1. Supervision of low-risk first pregnancy, second trimester  - [place] MFM OB COMP + 14 WK; Future  2. Encounter for supervision of low-risk pregnancy in second trimester   3. GBS bacteriuria  - Culture, OB Urine  Preterm labor symptoms and general obstetric precautions including but not limited to vaginal bleeding, contractions,  leaking of fluid and fetal movement were reviewed in detail with the patient. Please refer to After Visit Summary for other counseling recommendations.  Return in about 4 weeks (around 06/09/2017) for ROB/GTT.   Dorathy KinsmanVirginia Dan Scearce, CNM

## 2017-05-12 NOTE — Patient Instructions (Addendum)
AREA PEDIATRIC/FAMILY PRACTICE PHYSICIANS  [facility] [address], [address] [place], [place]  [address] Phone - [phone]   Fax - [phone]  [facility] [address] [place], [place] [address] Phone - [phone]   Fax - [phone]  JACK AMOS [address] [place], [place]  [address] Phone - [phone]   Fax - [phone]  [facility] [address] [place], [place]  [address] Phone - [phone]   Fax - [phone]  [facility] [address] [place], [place]  [address] Phone - [phone]   Fax - [phone]  [facility] [address] [place], [place]  [address] Phone - [phone]   Fax - [phone]  [facility] [address] [place], [place]  [address] Phone - [phone]   Fax - [phone]  [facility] [address] [place], [address] [place], [place]  [address] Phone - [phone]   Fax - [phone]  [facility] [address] [place], [place]  [address] Phone - [phone]   Fax - [phone] [facility] [place] [address] [place], [place]  [address] Phone - [phone]   Fax - [phone]  [facility] [address] [place], [place]  [address] Phone - [phone]   Fax - [phone]  [facility] [address], [address] [place], [place]  [address] Phone - [phone]   Fax - [phone]  [facility] [place] [address] [place], [place]  [address] Phone - [phone]   Fax - [phone]  [facility] [address], [address] [place], [place]  [address] Phone - [phone]  [facility] [address] [place], [place]  [address] Phone - [phone]   Fax - [phone]  [facility] [address] [place], [place]  [address] Phone - [phone]   Fax - [phone]  [facility] [address] [place], [place]  [address] Phone - [phone]   Fax - [phone]  [facility] [address] [place], [place]  [address] Phone - [phone]   Fax - [phone]   [facility] [address] [place], [place]  [address] Phone - [phone]   Fax - [phone]  [facility] [address] [place], [place]  [address] Phone - [phone]   Fax - [phone]  DAVID RUBIN [address] [place], [place]  [address] Phone - [phone]   Fax - [phone]  [facility] [address] [place], [place]  [address] Phone - [phone]   Fax - [phone]  [place] [facility] Alita Chyle [address] [place], [place]  [address] Phone - [phone]   Fax - [phone] Gerarda Fraction [address] [place], [place]  [address] Phone - [phone]   Fax - [phone]  [facility] [address] [place], [place]  [address] Phone - [phone]   Fax - [phone]  [facility] [place] [address] [place], [place]  [address] Phone - [phone]   Fax - [phone]  [facility] [place] Wyvonne Lenz MD [address] [place] [address] Phone [phone]  Fax [phone]    Preterm Labor and Birth Information The normal length of a pregnancy is 39-41  weeks. Preterm labor is when labor starts before 37 completed weeks of pregnancy. What are the risk factors for preterm labor? Preterm labor is more likely to occur in women who:  Have certain infections during pregnancy such as a bladder infection, sexually transmitted infection, or infection inside the uterus (chorioamnionitis).  Have a shorter-than-normal cervix.  Have gone into preterm labor  before.  Have had surgery on their cervix.  Are younger than age 25 or older than age 935.  Are African American.  Are pregnant with twins or multiple babies (multiple gestation).  Take street drugs or smoke while pregnant.  Do not gain enough weight while pregnant.  Became pregnant shortly after having been pregnant. What are the symptoms of preterm labor? Symptoms of preterm labor include:  Cramps similar to those that can happen during a menstrual period. The cramps may happen with diarrhea.  Pain in the abdomen or lower back.  Regular uterine contractions that may feel like tightening of the abdomen.  A feeling of increased pressure in the pelvis.  Increased watery or bloody mucus discharge from the vagina.  Water breaking (ruptured amniotic sac). Why is it important to recognize signs of preterm labor? It is important to recognize signs of preterm labor because babies who are born prematurely may not be fully developed. This can put them at an increased risk for:  Long-term (chronic) heart and lung problems.  Difficulty immediately after birth with regulating body systems, including blood sugar, body temperature, heart rate, and breathing rate.  Bleeding in the brain.  Cerebral palsy.  Learning difficulties.  Death. These risks are highest for babies who are born before 34 weeks of pregnancy. How is preterm labor treated? Treatment depends on the length of your pregnancy, your condition, and the health of your baby. It may involve:  Having a stitch (suture) placed in your cervix to prevent your cervix from opening too early (cerclage).  Taking or being given medicines, such as:  Hormone medicines. These may be given early in pregnancy to help support the pregnancy.  Medicine to stop contractions.  Medicines to help mature the baby's lungs. These may be prescribed if the risk of delivery is high.  Medicines to prevent your baby from developing cerebral  palsy. If the labor happens before 34 weeks of pregnancy, you may need to stay in the hospital. What should I do if I think I am in preterm labor? If you think that you are going into preterm labor, call your health care provider right away. How can I prevent preterm labor in future pregnancies? To increase your chance of having a full-term pregnancy:  Do not use any tobacco products, such as cigarettes, chewing tobacco, and e-cigarettes. If you need help quitting, ask your health care provider.  Do not use street drugs or medicines that have not been prescribed to you during your pregnancy.  Talk with your health care provider before taking any herbal supplements, even if you have been taking them regularly.  Make sure you gain a healthy amount of weight during your pregnancy.  Watch for infection. If you think that you might have an infection, get it checked right away.  Make sure to tell your health care provider if you have gone into preterm labor before. This information is not intended to replace advice given to you by your health care provider. Make sure you discuss any questions you have with your health care provider. Document Released: 02/21/2004 Document Revised: 05/13/2016 Document Reviewed: 04/23/2016 Elsevier  Interactive Patient Education  2017 [employer].   Tdap Vaccine (Tetanus, Diphtheria and Pertussis): What You Need to Know 1. Why get vaccinated? Tetanus, diphtheria and pertussis are very serious diseases. Tdap vaccine can protect [place] from these diseases. And, Tdap vaccine given to pregnant women can protect newborn babies against pertussis. TETANUS (Lockjaw) is rare in the [place] today. It causes painful muscle tightening and stiffness, usually all over the body.  It can lead to tightening of muscles in the head and neck so you can't open your mouth, swallow, or sometimes even breathe. Tetanus kills about 1 out of 10 people who are infected even after  receiving the best medical care. DIPHTHERIA is also rare in the [place] today. It can cause a thick coating to form in the back of the throat.  It can lead to breathing problems, heart failure, paralysis, and death. PERTUSSIS (Whooping Cough) causes severe coughing spells, which can cause difficulty breathing, vomiting and disturbed sleep.  It can also lead to weight loss, incontinence, and rib fractures. Up to 2 in 100 adolescents and 5 in 100 adults with pertussis are hospitalized or have complications, which could include pneumonia or death. These diseases are caused by bacteria. Diphtheria and pertussis are spread from person to person through secretions from coughing or sneezing. Tetanus enters the body through cuts, scratches, or wounds. Before vaccines, as many as 200,000 cases of diphtheria, 200,000 cases of pertussis, and hundreds of cases of tetanus, were reported in the [place] each year. Since vaccination began, reports of cases for tetanus and diphtheria have dropped by about 99% and for pertussis by about 80%. 2. Tdap vaccine Tdap vaccine can protect adolescents and adults from tetanus, diphtheria, and pertussis. One dose of Tdap is routinely given at age 2 or 72. People who did not get Tdap at that age should get it as soon as possible. Tdap is especially important for healthcare professionals and anyone having close contact with a baby younger than 12 months. Pregnant women should get a dose of Tdap during every pregnancy, to protect the newborn from pertussis. Infants are most at risk for severe, life-threatening complications from pertussis. Another vaccine, called Td, protects against tetanus and diphtheria, but not pertussis. A Td booster should be given every 10 years. Tdap may be given as one of these boosters if you have never gotten Tdap before. Tdap may also be given after a severe cut or burn to prevent tetanus infection. Your doctor or the person giving you the  vaccine can give you more information. Tdap may safely be given at the same time as other vaccines. 3. Some people should not get this vaccine  A person who has ever had a life-threatening allergic reaction after a previous dose of any diphtheria, tetanus or pertussis containing vaccine, OR has a severe allergy to any part of this vaccine, should not get Tdap vaccine. Tell the person giving the vaccine about any severe allergies.  Anyone who had coma or long repeated seizures within 7 days after a childhood dose of DTP or DTaP, or a previous dose of Tdap, should not get Tdap, unless a cause other than the vaccine was found. They can still get Td.  Talk to your doctor if you:  have seizures or another nervous system problem,  had severe pain or swelling after any vaccine containing diphtheria, tetanus or pertussis,  ever had a condition called Guillain-Barr Syndrome (GBS),  aren't feeling well on the day the shot is  scheduled. 4. Risks With any medicine, including vaccines, there is a chance of side effects. These are usually mild and go away on their own. Serious reactions are also possible but are rare. Most people who get Tdap vaccine do not have any problems with it. Mild problems following Tdap:  (Did not interfere with activities)  Pain where the shot was given (about 3 in 4 adolescents or 2 in 3 adults)  Redness or swelling where the shot was given (about 1 person in 5)  Mild fever of at least 100.63F (up to about 1 in 25 adolescents or 1 in 100 adults)  Headache (about 3 or 4 people in 10)  Tiredness (about 1 person in 3 or 4)  Nausea, vomiting, diarrhea, stomach ache (up to 1 in 4 adolescents or 1 in 10 adults)  Chills, sore joints (about 1 person in 10)  Body aches (about 1 person in 3 or 4)  Rash, swollen glands (uncommon) Moderate problems following Tdap:  (Interfered with activities, but did not require medical attention)  Pain where the shot was given (up to 1  in 5 or 6)  Redness or swelling where the shot was given (up to about 1 in 16 adolescents or 1 in 12 adults)  Fever over 102F (about 1 in 100 adolescents or 1 in 250 adults)  Headache (about 1 in 7 adolescents or 1 in 10 adults)  Nausea, vomiting, diarrhea, stomach ache (up to 1 or 3 people in 100)  Swelling of the entire arm where the shot was given (up to about 1 in 500). Severe problems following Tdap:  (Unable to perform usual activities; required medical attention)  Swelling, severe pain, bleeding and redness in the arm where the shot was given (rare). Problems that could happen after any vaccine:   People sometimes faint after a medical procedure, including vaccination. Sitting or lying down for about 15 minutes can help prevent fainting, and injuries caused by a fall. Tell your doctor if you feel dizzy, or have vision changes or ringing in the ears.  Some people get severe pain in the shoulder and have difficulty moving the arm where a shot was given. This happens very rarely.  Any medication can cause a severe allergic reaction. Such reactions from a vaccine are very rare, estimated at fewer than 1 in a million doses, and would happen within a few minutes to a few hours after the vaccination. As with any medicine, there is a very remote chance of a vaccine causing a serious injury or death. The safety of vaccines is always being monitored. For more information, visit: http://floyd.org/www.cdc.gov/vaccinesafety/ 5. What if there is a serious problem? What should I look for?  Look for anything that concerns you, such as signs of a severe allergic reaction, very high fever, or unusual behavior. Signs of a severe allergic reaction can include hives, swelling of the face and throat, difficulty breathing, a fast heartbeat, dizziness, and weakness. These would usually start a few minutes to a few hours after the vaccination. What should I do?   If you think it is a severe allergic reaction or other  emergency that can't wait, call 9-1-1 or get the person to the nearest hospital. Otherwise, call your doctor.  Afterward, the reaction should be reported to the Vaccine Adverse Event Reporting System (VAERS). Your doctor might file this report, or you can do it yourself through the VAERS web site at www.vaers.LAgents.nohhs.gov, or by calling 1-905-698-2927.  VAERS does not give medical  advice. 6. The National Vaccine Injury Compensation Program The [employer] Vaccine Injury Compensation Program (VICP) is a federal program that was created to compensate people who may have been injured by certain vaccines. Persons who believe they may have been injured by a vaccine can learn about the program and about filing a claim by calling 1-626-737-7408 or visiting the VICP website at SpiritualWord.at. There is a time limit to file a claim for compensation. 7. How can I learn more?  Ask your doctor. He or she can give you the vaccine package insert or suggest other sources of information.  Call your local or state health department.  Contact the Centers for Disease Control and Prevention (CDC):  Call (401)055-6960 (1-800-CDC-INFO) or  Visit CDC's website at PicCapture.uy CDC Tdap Vaccine VIS (02/07/14) This information is not intended to replace advice given to you by your health care provider. Make sure you discuss any questions you have with your health care provider. Document Released: 06/01/2012 Document Revised: 08/21/2016 Document Reviewed: 08/21/2016 Elsevier Interactive Patient Education  2017 [employer].

## 2017-05-13 NOTE — Progress Notes (Signed)
Per [employer] patient does not require pre authorization.

## 2017-05-19 ENCOUNTER — Encounter (HOSPITAL_COMMUNITY): Payer: Self-pay | Admitting: Advanced Practice Midwife

## 2017-05-19 LAB — URINE CULTURE, OB REFLEX

## 2017-05-19 LAB — CULTURE, OB URINE

## 2017-05-25 ENCOUNTER — Ambulatory Visit (HOSPITAL_COMMUNITY)
Admission: RE | Admit: 2017-05-25 | Discharge: 2017-05-25 | Disposition: A | Discharge status: Home / Self Care | Payer: Medicaid Other | Source: Ambulatory Visit | Attending: Advanced Practice Midwife | Admitting: Advanced Practice Midwife

## 2017-05-25 DIAGNOSIS — Z363 Encounter for antenatal screening for malformations: Secondary | ICD-10-CM | POA: Diagnosis present

## 2017-05-25 DIAGNOSIS — Z3A22 22 weeks gestation of pregnancy: Secondary | ICD-10-CM | POA: Diagnosis not present

## 2017-06-08 ENCOUNTER — Ambulatory Visit (INDEPENDENT_AMBULATORY_CARE_PROVIDER_SITE_OTHER): Payer: Medicaid Other | Admitting: Certified Nurse Midwife

## 2017-06-08 VITALS — BP 98/57 | HR 85 | Wt 124.7 lb

## 2017-06-08 DIAGNOSIS — Z3492 Encounter for supervision of normal pregnancy, unspecified, second trimester: Secondary | ICD-10-CM

## 2017-06-08 DIAGNOSIS — Z3482 Encounter for supervision of other normal pregnancy, second trimester: Secondary | ICD-10-CM

## 2017-06-08 NOTE — Progress Notes (Signed)
Subjective:  Lori Cardenas is a 26 y.o. G3P2002 at 9032w1d being seen today for ongoing prenatal care.  She is currently monitored for the following issues for this low-risk pregnancy and has Encounter for supervision of low-risk pregnancy; Encounter to determine fetal viability of pregnancy; Abdominal pain in pregnancy, first trimester; GBS (group B streptococcus) UTI complicating pregnancy; and Nausea/vomiting in pregnancy on her problem list.  Patient reports no complaints.  Contractions: Not present.  .  Movement: Present. Denies leaking of fluid.   The following portions of the patient's history were reviewed and updated as appropriate: allergies, current medications, past family history, past medical history, past social history, past surgical history and problem list. Problem list updated.  Objective:   Vitals:   06/08/17 1000  BP: (!) 98/57  Pulse: 85  Weight: 124 lb 11.2 oz (56.6 kg)    Fetal Status: Fetal Heart Rate (bpm): 159 Fundal Height: 24 cm Movement: Present     General:  Alert, oriented and cooperative. Patient is in no acute distress.  Skin: Skin is warm and dry. No rash noted.   Cardiovascular: Normal heart rate noted  Respiratory: Normal respiratory effort, no problems with respiration noted  Abdomen: Soft, gravid, appropriate for gestational age. Pain/Pressure: Absent     Pelvic:       Cervical exam deferred        Extremities: Normal range of motion.     Mental Status: Normal mood and affect. Normal behavior. Normal judgment and thought content.   Urinalysis:      Assessment and Plan:  Pregnancy: G3P2002 at 8432w1d  1. Encounter for supervision of low-risk pregnancy in second trimester - dating changed by 22 wk US - US MFM OB FOLLOW UP; Future - Second trimester anticipatory guidance  Preterm labor symptoms and general obstetric precautions including but not limited to vaginal bleeding, contractions, leaking of fluid and fetal movement were reviewed in detail with  the patient. Please refer to After Visit Summary for other counseling recommendations.  Return in about 4 weeks (around 07/06/2017).   Donette LarryBhambri, Tylar Merendino, CNM

## 2017-06-18 ENCOUNTER — Ambulatory Visit (HOSPITAL_COMMUNITY): Payer: Medicaid Other

## 2017-06-24 ENCOUNTER — Encounter: Payer: Medicaid Other | Admitting: Advanced Practice Midwife

## 2017-07-06 ENCOUNTER — Ambulatory Visit (INDEPENDENT_AMBULATORY_CARE_PROVIDER_SITE_OTHER): Payer: Medicaid Other | Admitting: Certified Nurse Midwife

## 2017-07-06 VITALS — BP 107/69 | HR 88 | Wt 128.0 lb

## 2017-07-06 DIAGNOSIS — Z23 Encounter for immunization: Secondary | ICD-10-CM | POA: Diagnosis not present

## 2017-07-06 DIAGNOSIS — Z3493 Encounter for supervision of normal pregnancy, unspecified, third trimester: Secondary | ICD-10-CM | POA: Diagnosis present

## 2017-07-06 DIAGNOSIS — O2610 Low weight gain in pregnancy, unspecified trimester: Secondary | ICD-10-CM | POA: Insufficient documentation

## 2017-07-06 DIAGNOSIS — O2613 Low weight gain in pregnancy, third trimester: Secondary | ICD-10-CM

## 2017-07-06 MED ORDER — COMFORT FIT MATERNITY SUPP MED MISC: 1.0000 | Freq: Every day | 0 refills | Status: DC

## 2017-07-06 NOTE — Progress Notes (Signed)
Subjective:  Lori Cardenas is a 26 y.o. G3P2002 at 7125w1d being seen today for ongoing prenatal care.  She is currently monitored for the following issues for this low-risk pregnancy and has Encounter for supervision of low-risk pregnancy; Encounter to determine fetal viability of pregnancy; Abdominal pain in pregnancy, first trimester; GBS (group B streptococcus) UTI complicating pregnancy; Nausea/vomiting in pregnancy; and Low weight gain during pregnancy on her problem list.  Patient reports backache and occasional ctx (2-3 per day).  Contractions: Irritability. Vag. Bleeding: None.  Movement: Present. Denies leaking of fluid.   The following portions of the patient's history were reviewed and updated as appropriate: allergies, current medications, past family history, past medical history, past social history, past surgical history and problem list. Problem list updated.  Objective:   Vitals:   07/06/17 0814  BP: 107/69  Pulse: 88  Weight: 128 lb (58.1 kg)    Fetal Status: Fetal Heart Rate (bpm): 142 Fundal Height: 27 cm Movement: Present  Presentation: Undeterminable  General:  Alert, oriented and cooperative. Patient is in no acute distress.  Skin: Skin is warm and dry. No rash noted.   Cardiovascular: Normal heart rate noted  Respiratory: Normal respiratory effort, no problems with respiration noted  Abdomen: Soft, gravid, appropriate for gestational age. Pain/Pressure: Present     Pelvic: Vag. Bleeding: None     Cervical exam deferred        Extremities: Normal range of motion.  Edema: None  Mental Status: Normal mood and affect. Normal behavior. Normal judgment and thought content.   Urinalysis:      Assessment and Plan:  Pregnancy: G3P2002 at 7525w1d  1. Encounter for supervision of low-risk pregnancy in third trimester  - Glucose Tolerance, 2 Hours w/1 Hour - CBC - HIV antibody (with reflex) - Tdap vaccine greater than or equal to 7yo IM - RPR - comfort measures for LBP  >Tylenol, heat, stretching, Rx maternity support belt  2. Low weight gain during pregnancy in third trimester - eating well - normal fundal height - consider growth US if FH falls off or low gain continues  Preterm labor symptoms and general obstetric precautions including but not limited to vaginal bleeding, contractions, leaking of fluid and fetal movement were reviewed in detail with the patient. Please refer to After Visit Summary for other counseling recommendations.  Return in about 2 weeks (around 07/20/2017).   Donette LarryBhambri, Caisley [place]end[place], CNM

## 2017-07-06 NOTE — Progress Notes (Signed)
Score was elevated on PHQ-9 but refused to talk to behavior clinician.

## 2017-07-07 LAB — CBC
HEMOGLOBIN: 11 g/dL — AB (ref 11.1–15.9)
Hematocrit: 33.6 % — ABNORMAL LOW (ref 34.0–46.6)
MCH: 29.1 pg (ref 26.6–33.0)
MCHC: 32.7 g/dL (ref 31.5–35.7)
MCV: 89 fL (ref 79–97)
Platelets: 211 10*3/uL (ref 150–379)
RBC: 3.78 x10E6/uL (ref 3.77–5.28)
RDW: 13.8 % (ref 12.3–15.4)
WBC: 12.1 10*3/uL — ABNORMAL HIGH (ref 3.4–10.8)

## 2017-07-07 LAB — RPR: RPR: NONREACTIVE

## 2017-07-07 LAB — GLUCOSE TOLERANCE, 2 HOURS W/ 1HR
GLUCOSE, 1 HOUR: 131 mg/dL (ref 65–179)
GLUCOSE, 2 HOUR: 73 mg/dL (ref 65–152)
Glucose, Fasting: 79 mg/dL (ref 65–91)

## 2017-07-07 LAB — HIV ANTIBODY (ROUTINE TESTING W REFLEX): HIV SCREEN 4TH GENERATION: NONREACTIVE

## 2017-07-15 ENCOUNTER — Other Ambulatory Visit: Payer: Self-pay | Admitting: Certified Nurse Midwife

## 2017-07-15 ENCOUNTER — Ambulatory Visit (HOSPITAL_COMMUNITY)
Admission: RE | Admit: 2017-07-15 | Discharge: 2017-07-15 | Disposition: A | Discharge status: Home / Self Care | Payer: Medicaid Other | Source: Ambulatory Visit | Attending: Certified Nurse Midwife | Admitting: Certified Nurse Midwife

## 2017-07-15 DIAGNOSIS — Z3A29 29 weeks gestation of pregnancy: Secondary | ICD-10-CM

## 2017-07-15 DIAGNOSIS — O2613 Low weight gain in pregnancy, third trimester: Secondary | ICD-10-CM | POA: Diagnosis present

## 2017-07-15 DIAGNOSIS — Z3492 Encounter for supervision of normal pregnancy, unspecified, second trimester: Secondary | ICD-10-CM

## 2017-07-20 ENCOUNTER — Encounter: Payer: Medicaid Other | Admitting: Student

## 2017-07-22 ENCOUNTER — Ambulatory Visit (INDEPENDENT_AMBULATORY_CARE_PROVIDER_SITE_OTHER): Payer: Medicaid Other | Admitting: Obstetrics and Gynecology

## 2017-07-22 VITALS — BP 110/70 | HR 80 | Wt 126.3 lb

## 2017-07-22 DIAGNOSIS — O2613 Low weight gain in pregnancy, third trimester: Secondary | ICD-10-CM

## 2017-07-22 DIAGNOSIS — O219 Vomiting of pregnancy, unspecified: Secondary | ICD-10-CM

## 2017-07-22 DIAGNOSIS — Z3493 Encounter for supervision of normal pregnancy, unspecified, third trimester: Secondary | ICD-10-CM

## 2017-07-22 LAB — POCT URINALYSIS DIP (DEVICE)
Bilirubin Urine: NEGATIVE
GLUCOSE, UA: NEGATIVE mg/dL
Hgb urine dipstick: NEGATIVE
Ketones, ur: NEGATIVE mg/dL
LEUKOCYTES UA: NEGATIVE
NITRITE: NEGATIVE
PROTEIN: 30 mg/dL — AB
SPECIFIC GRAVITY, URINE: 1.015 (ref 1.005–1.030)
UROBILINOGEN UA: 0.2 mg/dL (ref 0.0–1.0)
pH: 7 (ref 5.0–8.0)

## 2017-07-22 MED ORDER — COMFORT FIT MATERNITY SUPP MED MISC: 1.0000 | Freq: Every day | 0 refills | Status: DC

## 2017-07-22 MED ORDER — ONDANSETRON 8 MG PO TBDP: 8.0000 mg | ORAL_TABLET | Freq: Three times a day (TID) | ORAL | 0 refills | Status: DC | PRN

## 2017-07-22 MED ORDER — METOCLOPRAMIDE HCL 10 MG PO TABS: 10.0000 mg | ORAL_TABLET | Freq: Three times a day (TID) | ORAL | 3 refills | Status: DC

## 2017-07-22 NOTE — Progress Notes (Signed)
   PRENATAL VISIT NOTE  Subjective:  Lori Cardenas is a 26 y.o. G3P2002 at 3838w3d being seen today for ongoing prenatal care.  She is currently monitored for the following issues for this low-risk pregnancy and has Encounter for supervision of low-risk pregnancy; Encounter to determine fetal viability of pregnancy; Abdominal pain in pregnancy, first trimester; GBS (group B streptococcus) UTI complicating pregnancy; Nausea/vomiting in pregnancy; and Low weight gain during pregnancy on her problem list.  Patient reports nausea.  Contractions: Not present. Vag. Bleeding: None.  Movement: Present. Denies leaking of fluid.   The following portions of the patient's history were reviewed and updated as appropriate: allergies, current medications, past family history, past medical history, past social history, past surgical history and problem list. Problem list updated.  Objective:   Vitals:   07/22/17 1117  BP: 110/70  Pulse: 80  Weight: 126 lb 4.8 oz (57.3 kg)    Fetal Status: Fetal Heart Rate (bpm): 145 Fundal Height: 30 cm Movement: Present     General:  Alert, oriented and cooperative. Patient is in no acute distress.  Skin: Skin is warm and dry. No rash noted.   Cardiovascular: Normal heart rate noted  Respiratory: Normal respiratory effort, no problems with respiration noted  Abdomen: Soft, gravid, appropriate for gestational age.  Pain/Pressure: Present     Pelvic: Cervical exam deferred        Extremities: Normal range of motion.  Edema: Trace  Mental Status:  Normal mood and affect. Normal behavior. Normal judgment and thought content.   Assessment and Plan:  Pregnancy: G3P2002 at 6438w3d  1. Encounter for supervision of low-risk pregnancy in third trimester  - Discussed previous labs, 2 hour GTT normal   2. Nausea/vomiting in pregnancy  Vomiting 2-3 times per day.  RX reglan, Zofran Recommend Boost  UA today without signs of dehydration, push fluids at home   3. Low weight  gain during pregnancy in third trimester  - Recommend boost   Preterm labor symptoms and general obstetric precautions including but not limited to vaginal bleeding, contractions, leaking of fluid and fetal movement were reviewed in detail with the patient. Please refer to After Visit Summary for other counseling recommendations.  Return in about 2 weeks (around 08/05/2017).   Rasch, Harolyn RutherfordJennifer I, NP

## 2017-08-07 ENCOUNTER — Encounter: Payer: Self-pay | Admitting: Obstetrics and Gynecology

## 2017-08-07 ENCOUNTER — Ambulatory Visit (INDEPENDENT_AMBULATORY_CARE_PROVIDER_SITE_OTHER): Payer: Medicaid Other | Admitting: Obstetrics and Gynecology

## 2017-08-07 VITALS — BP 108/63 | HR 82 | Wt 127.4 lb

## 2017-08-07 DIAGNOSIS — Z3493 Encounter for supervision of normal pregnancy, unspecified, third trimester: Secondary | ICD-10-CM

## 2017-08-07 DIAGNOSIS — O2343 Unspecified infection of urinary tract in pregnancy, third trimester: Secondary | ICD-10-CM

## 2017-08-07 DIAGNOSIS — B951 Streptococcus, group B, as the cause of diseases classified elsewhere: Secondary | ICD-10-CM

## 2017-08-07 NOTE — Progress Notes (Signed)
Patient here today for Routine OB visit 32w 5d.

## 2017-08-07 NOTE — Progress Notes (Signed)
Subjective:  Lori Cardenas is a 26 y.o. G3P2002 at [redacted]w[redacted]d being seen today for ongoing prenatal care.  She is currently monitored for the following issues for this low-risk pregnancy and has Encounter for supervision of low-risk pregnancy; GBS (group B streptococcus) UTI complicating pregnancy; Nausea/vomiting in pregnancy; and Low weight gain during pregnancy on her problem list.  Patient reports backache.  Contractions: Not present. Vag. Bleeding: None.  Movement: Present. Denies leaking of fluid.   The following portions of the patient's history were reviewed and updated as appropriate: allergies, current medications, past family history, past medical history, past social history, past surgical history and problem list. Problem list updated.  Objective:   Vitals:   08/07/17 0922  BP: 108/63  Pulse: 82  Weight: 127 lb 6.4 oz (57.8 kg)    Fetal Status: Fetal Heart Rate (bpm): 140   Movement: Present     General:  Alert, oriented and cooperative. Patient is in no acute distress.  Skin: Skin is warm and dry. No rash noted.   Cardiovascular: Normal heart rate noted  Respiratory: Normal respiratory effort, no problems with respiration noted  Abdomen: Soft, gravid, appropriate for gestational age. Pain/Pressure: Present     Pelvic:  Cervical exam deferred        Extremities: Normal range of motion.  Edema: Trace  Mental Status: Normal mood and affect. Normal behavior. Normal judgment and thought content.   Urinalysis:      Assessment and Plan:  Pregnancy: G3P2002 at [redacted]w[redacted]d  1. Encounter for supervision of low-risk pregnancy in third trimester Stable Pt has Rx for maternal belt.  Pt instructed to go to medical supply store to fill  2. Group B Streptococcus urinary tract infection affecting pregnancy in third trimester Tx while in labor  Preterm labor symptoms and general obstetric precautions including but not limited to vaginal bleeding, contractions, leaking of fluid and fetal movement  were reviewed in detail with the patient. Please refer to After Visit Summary for other counseling recommendations.  Return in about 2 weeks (around 08/21/2017) for OB visit.   Hermina Staggers, MD

## 2017-08-07 NOTE — Patient Instructions (Signed)
Third Trimester of Pregnancy The third trimester is from week 28 through week 40 (months 7 through 9). The third trimester is a time when the unborn baby (fetus) is growing rapidly. At the end of the ninth month, the fetus is about 20 inches in length and weighs 6-10 pounds. Body changes during your third trimester Your body will continue to go through many changes during pregnancy. The changes vary from woman to woman. During the third trimester:  Your weight will continue to increase. You can expect to gain 25-35 pounds (11-16 kg) by the end of the pregnancy.  You may begin to get stretch marks on your hips, abdomen, and breasts.  You may urinate more often because the fetus is moving lower into your pelvis and pressing on your bladder.  You may develop or continue to have heartburn. This is caused by increased hormones that slow down muscles in the digestive tract.  You may develop or continue to have constipation because increased hormones slow digestion and cause the muscles that push waste through your intestines to relax.  You may develop hemorrhoids. These are swollen veins (varicose veins) in the rectum that can itch or be painful.  You may develop swollen, bulging veins (varicose veins) in your legs.  You may have increased body aches in the pelvis, back, or thighs. This is due to weight gain and increased hormones that are relaxing your joints.  You may have changes in your hair. These can include thickening of your hair, rapid growth, and changes in texture. Some women also have hair loss during or after pregnancy, or hair that feels dry or thin. Your hair will most likely return to normal after your baby is born.  Your breasts will continue to grow and they will continue to become tender. A yellow fluid (colostrum) may leak from your breasts. This is the first milk you are producing for your baby.  Your belly button may stick out.  You may notice more swelling in your hands,  face, or ankles.  You may have increased tingling or numbness in your hands, arms, and legs. The skin on your belly may also feel numb.  You may feel short of breath because of your expanding uterus.  You may have more problems sleeping. This can be caused by the size of your belly, increased need to urinate, and an increase in your body's metabolism.  You may notice the fetus "dropping," or moving lower in your abdomen (lightening).  You may have increased vaginal discharge.  You may notice your joints feel loose and you may have pain around your pelvic bone.  What to expect at prenatal visits You will have prenatal exams every 2 weeks until week 36. Then you will have weekly prenatal exams. During a routine prenatal visit:  You will be weighed to make sure you and the baby are growing normally.  Your blood pressure will be taken.  Your abdomen will be measured to track your baby's growth.  The fetal heartbeat will be listened to.  Any test results from the previous visit will be discussed.  You may have a cervical check near your due date to see if your cervix has softened or thinned (effaced).  You will be tested for Group B streptococcus. This happens between 35 and 37 weeks.  Your health care provider may ask you:  What your birth plan is.  How you are feeling.  If you are feeling the baby move.  If you have had   any abnormal symptoms, such as leaking fluid, bleeding, severe headaches, or abdominal cramping.  If you are using any tobacco products, including cigarettes, chewing tobacco, and electronic cigarettes.  If you have any questions.  Other tests or screenings that may be performed during your third trimester include:  Blood tests that check for low iron levels (anemia).  Fetal testing to check the health, activity level, and growth of the fetus. Testing is done if you have certain medical conditions or if there are problems during the  pregnancy.  Nonstress test (NST). This test checks the health of your baby to make sure there are no signs of problems, such as the baby not getting enough oxygen. During this test, a belt is placed around your belly. The baby is made to move, and its heart rate is monitored during movement.  What is false labor? False labor is a condition in which you feel small, irregular tightenings of the muscles in the womb (contractions) that usually go away with rest, changing position, or drinking water. These are called Braxton Hicks contractions. Contractions may last for hours, days, or even weeks before true labor sets in. If contractions come at regular intervals, become more frequent, increase in intensity, or become painful, you should see your health care provider. What are the signs of labor?  Abdominal cramps.  Regular contractions that start at 10 minutes apart and become stronger and more frequent with time.  Contractions that start on the top of the uterus and spread down to the lower abdomen and back.  Increased pelvic pressure and dull back pain.  A watery or bloody mucus discharge that comes from the vagina.  Leaking of amniotic fluid. This is also known as your "water breaking." It could be a slow trickle or a gush. Let your health care provider know if it has a color or strange odor. If you have any of these signs, call your health care provider right away, even if it is before your due date. Follow these instructions at home: Medicines  Follow your health care provider's instructions regarding medicine use. Specific medicines may be either safe or unsafe to take during pregnancy.  Take a prenatal vitamin that contains at least 600 micrograms (mcg) of folic acid.  If you develop constipation, try taking a stool softener if your health care provider approves. Eating and drinking  Eat a balanced diet that includes fresh fruits and vegetables, whole grains, good sources of protein  such as meat, eggs, or tofu, and low-fat dairy. Your health care provider will help you determine the amount of weight gain that is right for you.  Avoid raw meat and uncooked cheese. These carry germs that can cause birth defects in the baby.  If you have low calcium intake from food, talk to your health care provider about whether you should take a daily calcium supplement.  Eat four or five small meals rather than three large meals a day.  Limit foods that are high in fat and processed sugars, such as fried and sweet foods.  To prevent constipation: ? Drink enough fluid to keep your urine clear or pale yellow. ? Eat foods that are high in fiber, such as fresh fruits and vegetables, whole grains, and beans. Activity  Exercise only as directed by your health care provider. Most women can continue their usual exercise routine during pregnancy. Try to exercise for 30 minutes at least 5 days a week. Stop exercising if you experience uterine contractions.  Avoid heavy   lifting.  Do not exercise in extreme heat or humidity, or at high altitudes.  Wear low-heel, comfortable shoes.  Practice good posture.  You may continue to have sex unless your health care provider tells you otherwise. Relieving pain and discomfort  Take frequent breaks and rest with your legs elevated if you have leg cramps or low back pain.  Take warm sitz baths to soothe any pain or discomfort caused by hemorrhoids. Use hemorrhoid cream if your health care provider approves.  Wear a good support bra to prevent discomfort from breast tenderness.  If you develop varicose veins: ? Wear support pantyhose or compression stockings as told by your healthcare provider. ? Elevate your feet for 15 minutes, 3-4 times a day. Prenatal care  Write down your questions. Take them to your prenatal visits.  Keep all your prenatal visits as told by your health care provider. This is important. Safety  Wear your seat belt at  all times when driving.  Make a list of emergency phone numbers, including numbers for family, friends, the hospital, and police and fire departments. General instructions  Avoid cat litter boxes and soil used by cats. These carry germs that can cause birth defects in the baby. If you have a cat, ask someone to clean the litter box for you.  Do not travel far distances unless it is absolutely necessary and only with the approval of your health care provider.  Do not use hot tubs, steam rooms, or saunas.  Do not drink alcohol.  Do not use any products that contain nicotine or tobacco, such as cigarettes and e-cigarettes. If you need help quitting, ask your health care provider.  Do not use any medicinal herbs or unprescribed drugs. These chemicals affect the formation and growth of the baby.  Do not douche or use tampons or scented sanitary pads.  Do not cross your legs for long periods of time.  To prepare for the arrival of your baby: ? Take prenatal classes to understand, practice, and ask questions about labor and delivery. ? Make a trial run to the hospital. ? Visit the hospital and tour the maternity area. ? Arrange for maternity or paternity leave through employers. ? Arrange for family and friends to take care of pets while you are in the hospital. ? Purchase a rear-facing car seat and make sure you know how to install it in your car. ? Pack your hospital bag. ? Prepare the baby's nursery. Make sure to remove all pillows and stuffed animals from the baby's crib to prevent suffocation.  Visit your dentist if you have not gone during your pregnancy. Use a soft toothbrush to brush your teeth and be gentle when you floss. Contact a health care provider if:  You are unsure if you are in labor or if your water has broken.  You become dizzy.  You have mild pelvic cramps, pelvic pressure, or nagging pain in your abdominal area.  You have lower back pain.  You have persistent  nausea, vomiting, or diarrhea.  You have an unusual or bad smelling vaginal discharge.  You have pain when you urinate. Get help right away if:  Your water breaks before 37 weeks.  You have regular contractions less than 5 minutes apart before 37 weeks.  You have a fever.  You are leaking fluid from your vagina.  You have spotting or bleeding from your vagina.  You have severe abdominal pain or cramping.  You have rapid weight loss or weight gain.    You have shortness of breath with chest pain.  You notice sudden or extreme swelling of your face, hands, ankles, feet, or legs.  Your baby makes fewer than 10 movements in 2 hours.  You have severe headaches that do not go away when you take medicine.  You have vision changes. Summary  The third trimester is from week 28 through week 40, months 7 through 9. The third trimester is a time when the unborn baby (fetus) is growing rapidly.  During the third trimester, your discomfort may increase as you and your baby continue to gain weight. You may have abdominal, leg, and back pain, sleeping problems, and an increased need to urinate.  During the third trimester your breasts will keep growing and they will continue to become tender. A yellow fluid (colostrum) may leak from your breasts. This is the first milk you are producing for your baby.  False labor is a condition in which you feel small, irregular tightenings of the muscles in the womb (contractions) that eventually go away. These are called Braxton Hicks contractions. Contractions may last for hours, days, or even weeks before true labor sets in.  Signs of labor can include: abdominal cramps; regular contractions that start at 10 minutes apart and become stronger and more frequent with time; watery or bloody mucus discharge that comes from the vagina; increased pelvic pressure and dull back pain; and leaking of amniotic fluid. This information is not intended to replace advice  given to you by your health care provider. Make sure you discuss any questions you have with your health care provider. Document Released: 11/25/2001 Document Revised: 05/08/2016 Document Reviewed: 02/01/2013 Elsevier Interactive Patient Education  2017 [employer] Inc[employer]  

## 2017-08-20 ENCOUNTER — Ambulatory Visit (INDEPENDENT_AMBULATORY_CARE_PROVIDER_SITE_OTHER): Payer: Medicaid Other | Admitting: Obstetrics and Gynecology

## 2017-08-20 VITALS — BP 98/62 | HR 79 | Wt 129.0 lb

## 2017-08-20 DIAGNOSIS — Z23 Encounter for immunization: Secondary | ICD-10-CM | POA: Diagnosis not present

## 2017-08-20 DIAGNOSIS — Z3493 Encounter for supervision of normal pregnancy, unspecified, third trimester: Secondary | ICD-10-CM | POA: Diagnosis present

## 2017-08-20 DIAGNOSIS — O2343 Unspecified infection of urinary tract in pregnancy, third trimester: Secondary | ICD-10-CM | POA: Diagnosis not present

## 2017-08-20 DIAGNOSIS — B951 Streptococcus, group B, as the cause of diseases classified elsewhere: Secondary | ICD-10-CM | POA: Diagnosis not present

## 2017-08-20 DIAGNOSIS — M5431 Sciatica, right side: Secondary | ICD-10-CM

## 2017-08-20 NOTE — Progress Notes (Signed)
Subjective:  Laelia Aron Babai Bowdoin is a 26 y.o. G3P2002 at 1729w4d being seen today for ongoing prenatal care.  She is currently monitored for the following issues for this low-risk pregnancy and has Encounter for supervision of low-risk pregnancy; GBS (group B streptococcus) UTI complicating pregnancy; Nausea/vomiting in pregnancy; and Low weight gain during pregnancy on her problem list.  Patient reports leg pain. Only in the right leg. Radiated down back of leg and to side of leg. Contractions: Irritability. Vag. Bleeding: None.  Movement: Present. Denies leaking of fluid.   The following portions of the patient's history were reviewed and updated as appropriate: allergies, current medications, past family history, past medical history, past social history, past surgical history and problem list. Problem list updated.  Objective:   Vitals:   08/20/17 1028  BP: 98/62  Pulse: 79  Weight: 129 lb (58.5 kg)    Fetal Status: Fetal Heart Rate (bpm): 142   Movement: Present     General:  Alert, oriented and cooperative. Patient is in no acute distress.  Skin: Skin is warm and dry. No rash noted.   Cardiovascular: Normal heart rate noted  Respiratory: Normal respiratory effort, no problems with respiration noted  Abdomen: Soft, gravid, appropriate for gestational age. Pain/Pressure: Present     Pelvic: Vag. Bleeding: None     Cervical exam deferred        Extremities: Normal range of motion.  Edema: Trace  Mental Status: Normal mood and affect. Normal behavior. Normal judgment and thought content.   Urinalysis:      Assessment and Plan:  Pregnancy: G3P2002 at 7529w4d  1. Encounter for supervision of low-risk pregnancy in third trimester Continue routine prenatal care in low-risk patient.  - Flu Vaccine QUAD 36+ mos IM (Fluarix & Fluzone Quad PF  2. Group B Streptococcus urinary tract infection affecting pregnancy in third trimester Antibiotics during labor.  - Flu Vaccine QUAD 36+ mos IM (Fluarix &  Fluzone Quad PF  3. Sciatica of right side Has support belt Rx from last visit. Encouraged use. Tylenol for pain. Handout given. Conservative measures discussed.   Preterm labor symptoms and general obstetric precautions including but not limited to vaginal bleeding, contractions, leaking of fluid and fetal movement were reviewed in detail with the patient. Please refer to After Visit Summary for other counseling recommendations.  Return in about 2 weeks (around 09/03/2017) for ob visit.   Caryl AdaJazma Camara Renstrom, DO [facility], [facility] [place] 08/20/2017, 10:39 AM

## 2017-08-20 NOTE — Progress Notes (Deleted)
.  gb 

## 2017-08-20 NOTE — Patient Instructions (Addendum)
Sciatica Sciatica is pain, numbness, weakness, or tingling along your sciatic nerve. The sciatic nerve starts in the lower back and goes down the back of each leg. Sciatica happens when this nerve is pinched or has pressure put on it. Sciatica usually goes away on its own or with treatment. Sometimes, sciatica may keep coming back (recur). Follow these instructions at home: Medicines  Take over-the-counter and prescription medicines only as told by your doctor.  Do not drive or use heavy machinery while taking prescription pain medicine. Managing pain  If directed, put ice on the affected area. ? Put ice in a plastic bag. ? Place a towel between your skin and the bag. ? Leave the ice on for 20 minutes, 2-3 times a day.  After icing, apply heat to the affected area before you exercise or as often as told by your doctor. Use the heat source that your doctor tells you to use, such as a moist heat pack or a heating pad. ? Place a towel between your skin and the heat source. ? Leave the heat on for 20-30 minutes. ? Remove the heat if your skin turns bright red. This is especially important if you are unable to feel pain, heat, or cold. You may have a greater risk of getting burned. Activity  Return to your normal activities as told by your doctor. Ask your doctor what activities are safe for you. ? Avoid activities that make your sciatica worse.  Take short rests during the day. Rest in a lying or standing position. This is usually better than sitting to rest. ? When you rest for a long time, do some physical activity or stretching between periods of rest. ? Avoid sitting for a long time without moving. Get up and move around at least one time each hour.  Exercise and stretch regularly, as told by your doctor.  Do not lift anything that is heavier than 10 lb (4.5 kg) while you have symptoms of sciatica. ? Avoid lifting heavy things even when you do not have symptoms. ? Avoid lifting heavy  things over and over.  When you lift objects, always lift in a way that is safe for your body. To do this, you should: ? Bend your knees. ? Keep the object close to your body. ? Avoid twisting. General instructions  Use good posture. ? Avoid leaning forward when you are sitting. ? Avoid hunching over when you are standing.  Stay at a healthy weight.  Wear comfortable shoes that support your feet. Avoid wearing high heels.  Avoid sleeping on a mattress that is too soft or too hard. You might have less pain if you sleep on a mattress that is firm enough to support your back.  Keep all follow-up visits as told by your doctor. This is important. Contact a doctor if:  You have pain that: ? Wakes you up when you are sleeping. ? Gets worse when you lie down. ? Is worse than the pain you have had in the past. ? Lasts longer than 4 weeks.  You lose weight for without trying. Get help right away if:  You cannot control when you pee (urinate) or poop (have a bowel movement).  You have weakness in any of these areas and it gets worse. ? Lower back. ? Lower belly (pelvis). ? Butt (buttocks). ? Legs.  You have redness or swelling of your back.  You have a burning feeling when you pee. This information is not intended to replace   advice given to you by your health care provider. Make sure you discuss any questions you have with your health care provider. Document Released: 09/09/2008 Document Revised: 05/08/2016 Document Reviewed: 08/10/2015 Elsevier Interactive Patient Education  2018 [employer] Inc[employer]  

## 2017-09-03 ENCOUNTER — Ambulatory Visit (INDEPENDENT_AMBULATORY_CARE_PROVIDER_SITE_OTHER): Payer: Medicaid Other | Admitting: Advanced Practice Midwife

## 2017-09-03 ENCOUNTER — Encounter: Payer: Self-pay | Admitting: Advanced Practice Midwife

## 2017-09-03 ENCOUNTER — Other Ambulatory Visit: Payer: Self-pay | Admitting: Advanced Practice Midwife

## 2017-09-03 VITALS — BP 104/66 | HR 71 | Wt 129.1 lb

## 2017-09-03 DIAGNOSIS — Z3493 Encounter for supervision of normal pregnancy, unspecified, third trimester: Secondary | ICD-10-CM

## 2017-09-03 DIAGNOSIS — B951 Streptococcus, group B, as the cause of diseases classified elsewhere: Secondary | ICD-10-CM

## 2017-09-03 DIAGNOSIS — O2343 Unspecified infection of urinary tract in pregnancy, third trimester: Secondary | ICD-10-CM

## 2017-09-03 DIAGNOSIS — F5101 Primary insomnia: Secondary | ICD-10-CM

## 2017-09-03 MED ORDER — ZOLPIDEM TARTRATE 5 MG PO TABS: 5.0000 mg | ORAL_TABLET | Freq: Every evening | ORAL | 1 refills | Status: DC | PRN

## 2017-09-03 MED ORDER — ONDANSETRON 8 MG PO TBDP: 8.0000 mg | ORAL_TABLET | Freq: Three times a day (TID) | ORAL | 0 refills | Status: DC | PRN

## 2017-09-03 NOTE — Progress Notes (Signed)
PHq-9=16, Gad7= 12. Patient declines IBH

## 2017-09-03 NOTE — Patient Instructions (Signed)
Group B Streptococcus Infection During Pregnancy Group B Streptococcus (GBS) is a type of bacteria (Streptococcus agalactiae) that is often found in healthy people, commonly in the rectum, vagina, and intestines. In people who are healthy and not pregnant, the bacteria rarely cause serious illness or complications. However, women who test positive for GBS during pregnancy can pass the bacteria to their baby during childbirth, which can cause serious infection in the baby after birth. Women with GBS may also have infections during their pregnancy or immediately after childbirth, such as such as urinary tract infections (UTIs) or infections of the uterus (uterine infections). Having GBS also increases a woman's risk of complications during pregnancy, such as early (preterm) labor or delivery, miscarriage, or stillbirth. Routine testing (screening) for GBS is recommended for all pregnant women. What increases the risk? You may have a higher risk for GBS infection during pregnancy if you had one during a past pregnancy. What are the signs or symptoms? In most cases, GBS infection does not cause symptoms in pregnant women. Signs and symptoms of a possible GBS-related infection may include:  Labor starting before the 37th week of pregnancy.  A UTI or bladder infection, which may cause: ? Fever. ? Pain or burning during urination. ? Frequent urination.  Fever during labor, along with: ? Bad-smelling discharge. ? Uterine tenderness. ? Rapid heartbeat in the mother, baby, or both.  Rare but serious symptoms of a possible GBS-related infection in women include:  Blood infection (septicemia). This may cause fever, chills, or confusion.  Lung infection (pneumonia). This may cause fever, chills, cough, rapid breathing, difficulty breathing, or chest pain.  Bone, joint, skin, or soft tissue infection.  How is this diagnosed? You may be screened for GBS between week 35 and week 37 of your pregnancy. If  you have symptoms of preterm labor, you may be screened earlier. This condition is diagnosed based on lab test results from:  A swab of fluid from the vagina and rectum.  A urine sample.  How is this treated? This condition is treated with antibiotic medicine. When you go into labor, or as soon as your water breaks (your membranes rupture), you will be given antibiotics through an IV tube. Antibiotics will continue until after you give birth. If you are having a cesarean delivery, you do not need antibiotics unless your membranes have already ruptured. Follow these instructions at home:  Take over-the-counter and prescription medicines only as told by your health care provider.  Take your antibiotic medicine as told by your health care provider. Do not stop taking the antibiotic even if you start to feel better.  Keep all pre-birth (prenatal) visits and follow-up visits as told by your health care provider. This is important. Contact a health care provider if:  You have pain or burning when you urinate.  You have to urinate frequently.  You have a fever or chills.  You develop a bad-smelling vaginal discharge. Get help right away if:  Your membranes rupture.  You go into labor.  You have severe pain in your abdomen.  You have difficulty breathing.  You have chest pain. This information is not intended to replace advice given to you by your health care provider. Make sure you discuss any questions you have with your health care provider. Document Released: 03/09/2008 Document Revised: 06/27/2016 Document Reviewed: 06/26/2016 Elsevier Interactive Patient Education  2018 [employer].  

## 2017-09-03 NOTE — Progress Notes (Signed)
   PRENATAL VISIT NOTE  Subjective:  Lori Cardenas is a 26 y.o. G3P2002 at [redacted]w[redacted]d being seen today for ongoing prenatal care.  She is currently monitored for the following issues for this low-risk pregnancy and has Encounter for supervision of low-risk pregnancy; GBS (group B streptococcus) UTI complicating pregnancy; Nausea/vomiting in pregnancy; and Low weight gain during pregnancy on her problem list.  Patient reports insomnia.  Contractions: Irritability. Vag. Bleeding: None.  Movement: Present. Denies leaking of fluid.   The following portions of the patient's history were reviewed and updated as appropriate: allergies, current medications, past family history, past medical history, past social history, past surgical history and problem list. Problem list updated.  Objective:   Vitals:   09/03/17 0933  BP: 104/66  Pulse: 71  Weight: 129 lb 1.6 oz (58.6 kg)    Fetal Status: Fetal Heart Rate (bpm): 136 Fundal Height: 37 cm Movement: Present  Presentation: Vertex  General:  Alert, oriented and cooperative. Patient is in no acute distress.  Skin: Skin is warm and dry. No rash noted.   Cardiovascular: Normal heart rate noted  Respiratory: Normal respiratory effort, no problems with respiration noted  Abdomen: Soft, gravid, appropriate for gestational age.  Pain/Pressure: Present     Pelvic: Cervical exam performed        Extremities: Normal range of motion.  Edema: None  Mental Status:  Normal mood and affect. Normal behavior. Normal judgment and thought content.   Assessment and Plan:  Pregnancy: G3P2002 at [redacted]w[redacted]d  1. Supervision of low-risk pregnancy, third trimester  - Cervicovaginal ancillary only  2. Group B Streptococcus urinary tract infection affecting pregnancy in third trimester   3. Encounter for supervision of low-risk pregnancy in third trimester   Term labor symptoms and general obstetric precautions including but not limited to vaginal bleeding, contractions,  leaking of fluid and fetal movement were reviewed in detail with the patient. Please refer to After Visit Summary for other counseling recommendations.  Return in about 1 week (around 09/10/2017) for ROB.   Dorathy Kinsman, CNM

## 2017-09-03 NOTE — Addendum Note (Signed)
Addended by: Garret Reddish on: 09/03/2017 10:18 AM   Modules accepted: Orders

## 2017-09-03 NOTE — Progress Notes (Signed)
Rx renewed for Zofran ODT at pt request

## 2017-09-10 ENCOUNTER — Ambulatory Visit (INDEPENDENT_AMBULATORY_CARE_PROVIDER_SITE_OTHER): Payer: Medicaid Other | Admitting: Certified Nurse Midwife

## 2017-09-10 VITALS — BP 118/73 | HR 73 | Wt 129.0 lb

## 2017-09-10 DIAGNOSIS — O2613 Low weight gain in pregnancy, third trimester: Secondary | ICD-10-CM

## 2017-09-10 DIAGNOSIS — Z3493 Encounter for supervision of normal pregnancy, unspecified, third trimester: Secondary | ICD-10-CM

## 2017-09-10 DIAGNOSIS — O219 Vomiting of pregnancy, unspecified: Secondary | ICD-10-CM

## 2017-09-10 MED ORDER — ONDANSETRON 8 MG PO TBDP: 8.0000 mg | ORAL_TABLET | Freq: Three times a day (TID) | ORAL | 0 refills | Status: DC | PRN

## 2017-09-10 NOTE — Progress Notes (Signed)
Subjective:  Lori Cardenas is a 26 y.o. G3P2002 at [redacted]w[redacted]d being seen today for ongoing prenatal care.  She is currently monitored for the following issues for this low-risk pregnancy and has Encounter for supervision of low-risk pregnancy; GBS (group B streptococcus) UTI complicating pregnancy; Nausea/vomiting in pregnancy; and Low weight gain during pregnancy on her problem list.  Patient reports nausea and vomiting, daily, Zofran helps but haven't used d/t refill sent to wrong pharmacy. No using Unisom anymore d/t feeling drowsy.  Contractions: Irritability. Vag. Bleeding: None.  Movement: Present. Denies leaking of fluid.   The following portions of the patient's history were reviewed and updated as appropriate: allergies, current medications, past family history, past medical history, past social history, past surgical history and problem list. Problem list updated.  Objective:   Vitals:   09/10/17 1042  BP: 118/73  Pulse: 73  Weight: 129 lb (58.5 kg)    Fetal Status: Fetal Heart Rate (bpm): 145   Movement: Present     General:  Alert, oriented and cooperative. Patient is in no acute distress.  Skin: Skin is warm and dry. No rash noted.   Cardiovascular: Normal heart rate noted  Respiratory: Normal respiratory effort, no problems with respiration noted  Abdomen: Soft, gravid, appropriate for gestational age. Pain/Pressure: Present     Pelvic: Vag. Bleeding: None     Cervical exam deferred        Extremities: Normal range of motion.  Edema: None  Mental Status: Normal mood and affect. Normal behavior. Normal judgment and thought content.   Urinalysis:      Assessment and Plan:  Pregnancy: G3P2002 at [redacted]w[redacted]d  1. Nausea and vomiting during pregnancy - ondansetron (ZOFRAN ODT) 8 MG disintegrating tablet; Take 1 tablet (8 mg total) by mouth every 8 (eight) hours as needed for nausea or vomiting.  Dispense: 20 tablet; Refill: 0 - resume Unisom 1/2 tab at hs  2. Encounter for supervision  of low-risk pregnancy in third trimester  3. Low weight gain during pregnancy in third trimester - [place] at 29 wks showed growth 44%ile and nml AFV - f/u growth [place] next available  Term labor symptoms and general obstetric precautions including but not limited to vaginal bleeding, contractions, leaking of fluid and fetal movement were reviewed in detail with the patient. Please refer to After Visit Summary for other counseling recommendations.  Return in about 1 week (around 09/17/2017).   Donette Larry, CNM

## 2017-09-10 NOTE — Patient Instructions (Signed)

## 2017-09-14 ENCOUNTER — Ambulatory Visit (HOSPITAL_COMMUNITY)
Admission: RE | Admit: 2017-09-14 | Discharge: 2017-09-14 | Disposition: A | Discharge status: Home / Self Care | Payer: Medicaid Other | Source: Ambulatory Visit | Attending: Certified Nurse Midwife | Admitting: Certified Nurse Midwife

## 2017-09-14 DIAGNOSIS — O2613 Low weight gain in pregnancy, third trimester: Secondary | ICD-10-CM | POA: Diagnosis present

## 2017-09-14 DIAGNOSIS — Z3A38 38 weeks gestation of pregnancy: Secondary | ICD-10-CM | POA: Insufficient documentation

## 2017-09-17 ENCOUNTER — Ambulatory Visit (INDEPENDENT_AMBULATORY_CARE_PROVIDER_SITE_OTHER): Payer: Medicaid Other | Admitting: Student

## 2017-09-17 VITALS — BP 106/69 | HR 82 | Wt 129.8 lb

## 2017-09-17 DIAGNOSIS — Z3493 Encounter for supervision of normal pregnancy, unspecified, third trimester: Secondary | ICD-10-CM

## 2017-09-17 NOTE — Progress Notes (Signed)
   PRENATAL VISIT NOTE  Subjective:  Lori Cardenas is a 26 y.o. G3P2002 at [redacted]w[redacted]d being seen today for ongoing prenatal care.  She is currently monitored for the following issues for this low-risk pregnancy and has Encounter for supervision of low-risk pregnancy; GBS (group B streptococcus) UTI complicating pregnancy; Nausea/vomiting in pregnancy; and Low weight gain during pregnancy on her problem list.  Patient reports no complaints.  Contractions: Irritability. Vag. Bleeding: None.  Movement: Present. Denies leaking of fluid.   The following portions of the patient's history were reviewed and updated as appropriate: allergies, current medications, past family history, past medical history, past social history, past surgical history and problem list. Problem list updated.  Objective:   Vitals:   09/17/17 1052  BP: 106/69  Pulse: 82  Weight: 58.9 kg (129 lb 12.8 oz)    Fetal Status: Fetal Heart Rate (bpm): 126   Movement: Present     General:  Alert, oriented and cooperative. Patient is in no acute distress.  Skin: Skin is warm and dry. No rash noted.   Cardiovascular: Normal heart rate noted  Respiratory: Normal respiratory effort, no problems with respiration noted  Abdomen: Soft, gravid, appropriate for gestational age.  Pain/Pressure: Present     Pelvic: Cervical exam deferred        Extremities: Normal range of motion.  Edema: None  Mental Status:  Normal mood and affect. Normal behavior. Normal judgment and thought content.   Assessment and Plan:  Pregnancy: G3P2002 at [redacted]w[redacted]d  1. Encounter for supervision of low-risk pregnancy in third trimester    Patient doing well, no complaints.  Term labor symptoms and general obstetric precautions including but not limited to vaginal bleeding, contractions, leaking of fluid and fetal movement were reviewed in detail with the patient. Please refer to After Visit Summary for other counseling recommendations.  No Follow-up on  file.   Marylene Land, CNM

## 2017-09-17 NOTE — Patient Instructions (Signed)

## 2017-09-24 ENCOUNTER — Ambulatory Visit: Payer: Self-pay

## 2017-09-24 ENCOUNTER — Ambulatory Visit (INDEPENDENT_AMBULATORY_CARE_PROVIDER_SITE_OTHER): Payer: Medicaid Other | Admitting: Student

## 2017-09-24 ENCOUNTER — Other Ambulatory Visit: Payer: Self-pay | Admitting: Student

## 2017-09-24 VITALS — BP 103/75 | HR 94 | Wt 131.3 lb

## 2017-09-24 DIAGNOSIS — O4103X0 Oligohydramnios, third trimester, not applicable or unspecified: Secondary | ICD-10-CM | POA: Diagnosis not present

## 2017-09-24 DIAGNOSIS — Z3493 Encounter for supervision of normal pregnancy, unspecified, third trimester: Secondary | ICD-10-CM

## 2017-09-24 NOTE — Progress Notes (Signed)
Pt informed that the ultrasound is considered a limited OB ultrasound and is not intended to be a complete ultrasound exam.  Patient also informed that the ultrasound is not being completed with the intent of assessing for fetal or placental anomalies or any pelvic abnormalities.  Explained that the purpose of today's ultrasound is to assess for amniotic fluid volume.  Patient acknowledges the purpose of the exam and the limitations of the study.    

## 2017-09-24 NOTE — Progress Notes (Addendum)
   PRENATAL VISIT NOTE  Subjective:  Lori Cardenas is a 26 y.o. G3P2002 at [redacted]w[redacted]d being seen today for ongoing prenatal care.  She is currently monitored for the following issues for this low-risk pregnancy and has Encounter for supervision of low-risk pregnancy; GBS (group B streptococcus) UTI complicating pregnancy; Nausea/vomiting in pregnancy; and Low weight gain during pregnancy on her problem list.  Patient reports no complaints.  Contractions: Irregular. Vag. Bleeding: None.  Movement: Present. Denies leaking of fluid.   The following portions of the patient's history were reviewed and updated as appropriate: allergies, current medications, past family history, past medical history, past social history, past surgical history and problem list. Problem list updated.  Objective:   Vitals:   09/24/17 0949  BP: 103/75  Pulse: 94  Weight: 131 lb 4.8 oz (59.6 kg)    Fetal Status: Fetal Heart Rate (bpm): 137 Fundal Height: 39 cm Movement: Present     General:  Alert, oriented and cooperative. Patient is in no acute distress.  Skin: Skin is warm and dry. No rash noted.   Cardiovascular: Normal heart rate noted  Respiratory: Normal respiratory effort, no problems with respiration noted  Abdomen: Soft, gravid, appropriate for gestational age.  Pain/Pressure: Present     Pelvic: Cervical exam performed        Extremities: Normal range of motion.  Edema: None  Mental Status:  Normal mood and affect. Normal behavior. Normal judgment and thought content.   Assessment and Plan:  Pregnancy: G3P2002 at [redacted]w[redacted]d  1. Encounter for supervision of low-risk pregnancy in third trimester    2. Plan for BPP next week; induction scheduled for 10-04-2017.   3. AFI today was 12 (normal).  Term labor symptoms and general obstetric precautions including but not limited to vaginal bleeding, contractions, leaking of fluid and fetal movement were reviewed in detail with the patient. Please refer to After Visit  Summary for other counseling recommendations.  Return in about 1 week (around 10/01/2017), or ROB.   Marylene Land, CNM

## 2017-09-24 NOTE — Patient Instructions (Signed)
Labor Induction Labor induction is when steps are taken to cause a pregnant woman to begin the labor process. Most women go into labor on their own between 37 weeks and 42 weeks of the pregnancy. When this does not happen or when there is a medical need, methods may be used to induce labor. Labor induction causes a pregnant woman's uterus to contract. It also causes the cervix to soften (ripen), open (dilate), and thin out (efface). Usually, labor is not induced before 39 weeks of the pregnancy unless there is a problem with the baby or mother. Before inducing labor, your health care provider will consider a number of factors, including the following:  The medical condition of you and the baby.  How many weeks along you are.  The status of the baby's lung maturity.  The condition of the cervix.  The position of the baby. What are the reasons for labor induction? Labor may be induced for the following reasons:  The health of the baby or mother is at risk.  The pregnancy is overdue by 1 week or more.  The water breaks but labor does not start on its own.  The mother has a health condition or serious illness, such as high blood pressure, infection, placental abruption, or diabetes.  The amniotic fluid amounts are low around the baby.  The baby is distressed. Convenience or wanting the baby to be born on a certain date is not a reason for inducing labor. What methods are used for labor induction? Several methods of labor induction may be used, such as:  Prostaglandin medicine. This medicine causes the cervix to dilate and ripen. The medicine will also start contractions. It can be taken by mouth or by inserting a suppository into the vagina.  Inserting a thin tube (catheter) with a balloon on the end into the vagina to dilate the cervix. Once inserted, the balloon is expanded with water, which causes the cervix to open.  Stripping the membranes. Your health care provider separates  amniotic sac tissue from the cervix, causing the cervix to be stretched and causing the release of a hormone called progesterone. This may cause the uterus to contract. It is often done during an office visit. You will be sent home to wait for the contractions to begin. You will then come in for an induction.  Breaking the water. Your health care provider makes a hole in the amniotic sac using a small instrument. Once the amniotic sac breaks, contractions should begin. This may still take hours to see an effect.  Medicine to trigger or strengthen contractions. This medicine is given through an IV access tube inserted into a vein in your arm. All of the methods of induction, besides stripping the membranes, will be done in the hospital. Induction is done in the hospital so that you and the baby can be carefully monitored. How long does it take for labor to be induced? Some inductions can take up to 2-3 days. Depending on the cervix, it usually takes less time. It takes longer when you are induced early in the pregnancy or if this is your first pregnancy. If a mother is still pregnant and the induction has been going on for 2-3 days, either the mother will be sent home or a cesarean delivery will be needed. What are the risks associated with labor induction? Some of the risks of induction include:  Changes in fetal heart rate, such as too high, too low, or erratic.  Fetal distress.    Chance of infection for the mother and baby.  Increased chance of having a cesarean delivery.  Breaking off (abruption) of the placenta from the uterus (rare).  Uterine rupture (very rare). When induction is needed for medical reasons, the benefits of induction may outweigh the risks. What are some reasons for not inducing labor? Labor induction should not be done if:  It is shown that your baby does not tolerate labor.  You have had previous surgeries on your uterus, such as a myomectomy or the removal of  fibroids.  Your placenta lies very low in the uterus and blocks the opening of the cervix (placenta previa).  Your baby is not in a head-down position.  The umbilical cord drops down into the birth canal in front of the baby. This could cut off the baby's blood and oxygen supply.  You have had a previous cesarean delivery.  There are unusual circumstances, such as the baby being extremely premature. This information is not intended to replace advice given to you by your health care provider. Make sure you discuss any questions you have with your health care provider. Document Released: 04/22/2007 Document Revised: 05/08/2016 Document Reviewed: 06/30/2013 Elsevier Interactive Patient Education  2017 [employer] Inc[employer]  

## 2017-09-24 NOTE — Addendum Note (Signed)
Addended by: Jill Side on: 09/24/2017 10:56 AM   Modules accepted: Orders

## 2017-09-25 ENCOUNTER — Encounter (HOSPITAL_COMMUNITY): Payer: Self-pay | Admitting: *Deleted

## 2017-09-25 ENCOUNTER — Telehealth (HOSPITAL_COMMUNITY): Payer: Self-pay | Admitting: *Deleted

## 2017-09-25 NOTE — Telephone Encounter (Signed)
Preadmission screen  

## 2017-09-27 ENCOUNTER — Inpatient Hospital Stay (HOSPITAL_COMMUNITY)
Admission: AD | Admit: 2017-09-27 | Discharge: 2017-09-28 | DRG: 807 | Disposition: A | Discharge status: Home / Self Care | Payer: Medicaid Other | Source: Ambulatory Visit | Attending: Obstetrics and Gynecology | Admitting: Obstetrics and Gynecology

## 2017-09-27 ENCOUNTER — Encounter (HOSPITAL_COMMUNITY): Payer: Self-pay

## 2017-09-27 DIAGNOSIS — Z3A40 40 weeks gestation of pregnancy: Secondary | ICD-10-CM

## 2017-09-27 DIAGNOSIS — Z3493 Encounter for supervision of normal pregnancy, unspecified, third trimester: Secondary | ICD-10-CM

## 2017-09-27 DIAGNOSIS — B951 Streptococcus, group B, as the cause of diseases classified elsewhere: Secondary | ICD-10-CM

## 2017-09-27 DIAGNOSIS — O2613 Low weight gain in pregnancy, third trimester: Secondary | ICD-10-CM

## 2017-09-27 DIAGNOSIS — O2343 Unspecified infection of urinary tract in pregnancy, third trimester: Secondary | ICD-10-CM

## 2017-09-27 DIAGNOSIS — O26893 Other specified pregnancy related conditions, third trimester: Secondary | ICD-10-CM | POA: Diagnosis present

## 2017-09-27 DIAGNOSIS — O99824 Streptococcus B carrier state complicating childbirth: Secondary | ICD-10-CM | POA: Diagnosis not present

## 2017-09-27 DIAGNOSIS — O219 Vomiting of pregnancy, unspecified: Secondary | ICD-10-CM

## 2017-09-27 LAB — CBC
HEMATOCRIT: 32.1 % — AB (ref 36.0–46.0)
HEMOGLOBIN: 11.3 g/dL — AB (ref 12.0–15.0)
MCH: 29.7 pg (ref 26.0–34.0)
MCHC: 35.2 g/dL (ref 30.0–36.0)
MCV: 84.3 fL (ref 78.0–100.0)
Platelets: 242 10*3/uL (ref 150–400)
RBC: 3.81 MIL/uL — AB (ref 3.87–5.11)
RDW: 13.9 % (ref 11.5–15.5)
WBC: 13.5 10*3/uL — ABNORMAL HIGH (ref 4.0–10.5)

## 2017-09-27 LAB — TYPE AND SCREEN
ABO/RH(D): AB POS
Antibody Screen: NEGATIVE

## 2017-09-27 MED ORDER — PENICILLIN G POTASSIUM 5000000 UNITS IJ SOLR
5.0000 10*6.[IU] | Freq: Once | INTRAVENOUS | Status: AC
  Administered 2017-09-27: 5 10*6.[IU] via INTRAVENOUS
  Filled 2017-09-27: qty 5

## 2017-09-27 MED ORDER — DIBUCAINE 1 % RE OINT: 1.0000 "application " | TOPICAL_OINTMENT | RECTAL | Status: DC | PRN

## 2017-09-27 MED ORDER — OXYCODONE-ACETAMINOPHEN 5-325 MG PO TABS: 2.0000 | ORAL_TABLET | ORAL | Status: DC | PRN

## 2017-09-27 MED ORDER — ACETAMINOPHEN 325 MG PO TABS
650.0000 mg | ORAL_TABLET | ORAL | Status: DC | PRN
  Administered 2017-09-27: 650 mg via ORAL
  Filled 2017-09-27: qty 2

## 2017-09-27 MED ORDER — SIMETHICONE 80 MG PO CHEW: 80.0000 mg | CHEWABLE_TABLET | ORAL | Status: DC | PRN

## 2017-09-27 MED ORDER — ONDANSETRON HCL 4 MG/2ML IJ SOLN: 4.0000 mg | Freq: Four times a day (QID) | INTRAMUSCULAR | Status: DC | PRN

## 2017-09-27 MED ORDER — LACTATED RINGERS IV SOLN
INTRAVENOUS | Status: DC
  Administered 2017-09-27 (×2): via INTRAVENOUS

## 2017-09-27 MED ORDER — BENZOCAINE-MENTHOL 20-0.5 % EX AERO
1.0000 "application " | INHALATION_SPRAY | CUTANEOUS | Status: DC | PRN
  Filled 2017-09-27: qty 56

## 2017-09-27 MED ORDER — LACTATED RINGERS IV SOLN: 500.0000 mL | INTRAVENOUS | Status: DC | PRN

## 2017-09-27 MED ORDER — PRENATAL MULTIVITAMIN CH
1.0000 | ORAL_TABLET | Freq: Every day | ORAL | Status: DC
  Administered 2017-09-28: 1 via ORAL
  Filled 2017-09-27: qty 1

## 2017-09-27 MED ORDER — OXYCODONE-ACETAMINOPHEN 5-325 MG PO TABS
1.0000 | ORAL_TABLET | ORAL | Status: DC | PRN
  Administered 2017-09-27 – 2017-09-28 (×3): 1 via ORAL
  Filled 2017-09-27 (×3): qty 1

## 2017-09-27 MED ORDER — LIDOCAINE HCL (PF) 1 % IJ SOLN
30.0000 mL | INTRAMUSCULAR | Status: DC | PRN
  Filled 2017-09-27: qty 30

## 2017-09-27 MED ORDER — FENTANYL CITRATE (PF) 100 MCG/2ML IJ SOLN
INTRAMUSCULAR | Status: AC
  Filled 2017-09-27: qty 2

## 2017-09-27 MED ORDER — ACETAMINOPHEN 325 MG PO TABS: 650.0000 mg | ORAL_TABLET | ORAL | Status: DC | PRN

## 2017-09-27 MED ORDER — SODIUM CHLORIDE 0.9% FLUSH: 3.0000 mL | INTRAVENOUS | Status: DC | PRN

## 2017-09-27 MED ORDER — TERBUTALINE SULFATE 1 MG/ML IJ SOLN
0.2500 mg | Freq: Once | INTRAMUSCULAR | Status: DC | PRN
  Filled 2017-09-27: qty 1

## 2017-09-27 MED ORDER — ONDANSETRON HCL 4 MG PO TABS: 4.0000 mg | ORAL_TABLET | ORAL | Status: DC | PRN

## 2017-09-27 MED ORDER — SENNOSIDES-DOCUSATE SODIUM 8.6-50 MG PO TABS: 2.0000 | ORAL_TABLET | ORAL | Status: DC

## 2017-09-27 MED ORDER — ZOLPIDEM TARTRATE 5 MG PO TABS: 5.0000 mg | ORAL_TABLET | Freq: Every evening | ORAL | Status: DC | PRN

## 2017-09-27 MED ORDER — IBUPROFEN 600 MG PO TABS
600.0000 mg | ORAL_TABLET | Freq: Four times a day (QID) | ORAL | Status: DC
  Administered 2017-09-27 – 2017-09-28 (×4): 600 mg via ORAL
  Filled 2017-09-27 (×4): qty 1

## 2017-09-27 MED ORDER — MEASLES, MUMPS & RUBELLA VAC ~~LOC~~ INJ: 0.5000 mL | INJECTION | Freq: Once | SUBCUTANEOUS | Status: DC

## 2017-09-27 MED ORDER — DIPHENHYDRAMINE HCL 25 MG PO CAPS: 25.0000 mg | ORAL_CAPSULE | Freq: Four times a day (QID) | ORAL | Status: DC | PRN

## 2017-09-27 MED ORDER — SODIUM CHLORIDE 0.9 % IV SOLN: 250.0000 mL | INTRAVENOUS | Status: DC | PRN

## 2017-09-27 MED ORDER — OXYCODONE-ACETAMINOPHEN 5-325 MG PO TABS
1.0000 | ORAL_TABLET | ORAL | Status: DC | PRN
  Administered 2017-09-27: 1 via ORAL
  Filled 2017-09-27: qty 1

## 2017-09-27 MED ORDER — OXYTOCIN BOLUS FROM INFUSION
500.0000 mL | Freq: Once | INTRAVENOUS | Status: AC
  Administered 2017-09-27: 500 mL via INTRAVENOUS

## 2017-09-27 MED ORDER — FENTANYL CITRATE (PF) 100 MCG/2ML IJ SOLN
100.0000 ug | INTRAMUSCULAR | Status: DC | PRN
  Administered 2017-09-27 (×3): 100 ug via INTRAVENOUS
  Filled 2017-09-27 (×2): qty 2

## 2017-09-27 MED ORDER — SOD CITRATE-CITRIC ACID 500-334 MG/5ML PO SOLN: 30.0000 mL | ORAL | Status: DC | PRN

## 2017-09-27 MED ORDER — ONDANSETRON HCL 4 MG/2ML IJ SOLN: 4.0000 mg | INTRAMUSCULAR | Status: DC | PRN

## 2017-09-27 MED ORDER — WITCH HAZEL-GLYCERIN EX PADS: 1.0000 "application " | MEDICATED_PAD | CUTANEOUS | Status: DC | PRN

## 2017-09-27 MED ORDER — COCONUT OIL OIL: 1.0000 "application " | TOPICAL_OIL | Status: DC | PRN

## 2017-09-27 MED ORDER — OXYTOCIN 40 UNITS IN LACTATED RINGERS INFUSION - SIMPLE MED
1.0000 m[IU]/min | INTRAVENOUS | Status: DC
  Administered 2017-09-27: 2 m[IU]/min via INTRAVENOUS

## 2017-09-27 MED ORDER — OXYTOCIN 40 UNITS IN LACTATED RINGERS INFUSION - SIMPLE MED
2.5000 [IU]/h | INTRAVENOUS | Status: DC
  Filled 2017-09-27: qty 1000

## 2017-09-27 MED ORDER — SODIUM CHLORIDE 0.9% FLUSH: 3.0000 mL | Freq: Two times a day (BID) | INTRAVENOUS | Status: DC

## 2017-09-27 MED ORDER — TETANUS-DIPHTH-ACELL PERTUSSIS 5-2.5-18.5 LF-MCG/0.5 IM SUSP: 0.5000 mL | Freq: Once | INTRAMUSCULAR | Status: DC

## 2017-09-27 MED ORDER — PENICILLIN G POT IN DEXTROSE 60000 UNIT/ML IV SOLN
3.0000 10*6.[IU] | INTRAVENOUS | Status: DC
  Administered 2017-09-27: 3 10*6.[IU] via INTRAVENOUS
  Filled 2017-09-27 (×7): qty 50

## 2017-09-27 NOTE — Progress Notes (Signed)
Called Cinthya Bowmaker-Kareem, CNM due to patient complaining of nasal congestion and cramping with breast feeding.  Pain is not relieved with motrin and ibuprofen.  Ok to give Percocet 1 tablet now, CNM recommended saline drops for nasal congestion.  Would like call back if not relieved.  Notified pt and verbalized understanding.  Will continue to monitor.

## 2017-09-27 NOTE — MAU Note (Signed)
Contractions started around 0200, every 3 mins. Pain 7-8/10. No fluid/bleeding. Good FM.

## 2017-09-27 NOTE — H&P (Signed)
Lori Cardenas is a 26 y.o. female presenting for with ctx starting at 0200 with regular ctx increasing in intensity. Denies any LOF or VB.  +FM. Denies any complications this pregnancy. 2 previous SVD with no complications. Desires natural childbirth with no epidural.  Plans to breastfeed inf.  Desires depo PP.    OB History    Gravida Para Term Preterm AB Living   SAB TAB Ectopic Multiple Live Births         0 2     Past Medical History:  Diagnosis Date  . Medical history non-contributory    Past Surgical History:  Procedure Laterality Date  . NO PAST SURGERIES     Family History: family history includes Diabetes in her mother; Heart disease (age of onset: 40) in her mother; Hypertension in her mother. Social History:  reports that she has never smoked. She has never used smokeless tobacco. She reports that she does not drink alcohol or use drugs.     Maternal Diabetes: No Genetic Screening: Normal Maternal Ultrasounds/Referrals: Normal Fetal Ultrasounds or other Referrals:  None Maternal Substance Abuse:  No Significant Maternal Medications:  None Significant Maternal Lab Results:  Lab values include: Group B Strep positive Other Comments:  Rubella immune, sickle cell screening normal   Review of Systems  All other systems reviewed and are negative.  Maternal Medical History:  Reason for admission: Contractions.   Contractions: Onset was 6-12 hours ago.   Frequency: regular.   Duration is approximately 5 minutes.   Perceived severity is moderate.    Fetal activity: Perceived fetal activity is normal.   Last perceived fetal movement was within the past hour.    Prenatal complications: no prenatal complications Prenatal Complications - Diabetes: none.    Dilation: 5.5 Effacement (%): 90 Station: -2 Exam by:: Everlene Farrier, RN Blood pressure 109/74, pulse 85, temperature (!) 97.5 F (36.4 C), temperature source Oral, resp. rate 18, height 5' (1.524 m),  weight 59 kg (130 lb), last menstrual period 12/08/2016, currently breastfeeding.  Membranes intact  Maternal Exam:  Uterine Assessment: Contraction strength is moderate.  Contraction duration is 5 minutes. Contraction frequency is regular.   Abdomen: Patient reports no abdominal tenderness. Fetal presentation: vertex  Introitus: Normal vulva. Normal vagina.  Pelvis: adequate for delivery.   Cervix: Cervix evaluated by digital exam.     Fetal Exam Fetal Monitor Review: Baseline rate: 130s.  Variability: moderate (6-25 bpm).   Pattern: accelerations present and early decelerations.   Occasional early with positioning.  Fetal State Assessment: Category I - tracings are normal.     Physical Exam  Nursing note and vitals reviewed. Constitutional: She is oriented to person, place, and time. She appears well-developed and well-nourished.  HENT:  Head: Normocephalic.  Eyes: Pupils are equal, round, and reactive to light.  Neck: Normal range of motion. Neck supple.  Cardiovascular: Normal rate, regular rhythm and normal heart sounds.   Respiratory: Effort normal and breath sounds normal.  GI: Soft.  ctx  Genitourinary: Vagina normal and uterus normal.  Genitourinary Comments: Gravid uterus  Musculoskeletal: Normal range of motion. She exhibits no edema.  Neurological: She is alert and oriented to person, place, and time. She has normal reflexes.  Skin: Skin is warm and dry.  Psychiatric: She has a normal mood and affect. Her behavior is normal.    Prenatal labs: ABO, Rh: AB/Positive/-- (04/03 1122) Antibody: Negative (04/03 1122) Rubella: 13.10 (04/03  1122) RPR: Non Reactive (07/23 0901)  HBsAg: Negative (04/03 1122)  HIV:   neg GBS:   pos - urine  Assessment/Plan: G3P2 @ 40wk SOL GBS +   Plan Expectant management active labor May have IV pain medication or epidural if desires IV Penicillin intrapartum Consult physician prn Anticipate SVD   Cinthya  Bowmaker-Kareen, SNM 09/27/2017, 9:13 AM  I was present at delivery and agree with above assessment

## 2017-09-27 NOTE — Anesthesia Pain Management Evaluation Note (Signed)
  CRNA Pain Management Visit Note  Patient: Lori Cardenas, 26 y.o., female  "Hello I am a member of the anesthesia team at [facility]. We have an anesthesia team available at all times to provide care throughout the hospital, including epidural management and anesthesia for C-section. I don't know your plan for the delivery whether it a natural birth, water birth, IV sedation, nitrous supplementation, doula or epidural, but we want to meet your pain goals."   1.Was your pain managed to your expectations on prior hospitalizations?   Yes   2.What is your expectation for pain management during this hospitalization?     IV pain meds  3.How can we help you reach that goal? Natural with IV pain meds  Record the patient's initial score and the patient's pain goal.   Pain: 8  Pain Goal: 10 [facility] wants you to be able to say your pain was always managed very well.  Rica Records 09/27/2017

## 2017-09-27 NOTE — Progress Notes (Signed)
Per Dr Rachelle Hora, okay for patient to be off monitors and take a walk for an hour.

## 2017-09-27 NOTE — MAU Note (Signed)
Urine sent to lab 

## 2017-09-28 LAB — RPR: RPR: NONREACTIVE

## 2017-09-28 MED ORDER — OXYCODONE-ACETAMINOPHEN 5-325 MG PO TABS
1.0000 | ORAL_TABLET | ORAL | 0 refills | Status: DC | PRN
Start: 2017-09-28 — End: 2018-05-28

## 2017-09-28 MED ORDER — OXYCODONE-ACETAMINOPHEN 5-325 MG PO TABS: 1.0000 | ORAL_TABLET | ORAL | 0 refills | Status: DC | PRN

## 2017-09-28 NOTE — Discharge Summary (Signed)
OB Discharge Summary     Patient Name: Lori Cardenas DOB: 02/16/1991 MRN: [id number] Date of admission: 09/27/2017  Delivering MD: Lauro Franklin )  Date of discharge: 09/28/2017    Admitting diagnosis: pregnancy at [redacted] weeks gestation Intrauterine pregnancy: [redacted]w[redacted]d    Secondary diagnosis:  Active Problems:   Patient Active Problem List   Diagnosis Date Noted  . Labor and delivery indication for care or intervention 09/27/2017  . Low weight gain during pregnancy 07/06/2017  . Nausea/vomiting in pregnancy 04/14/2017  . GBS (group B streptococcus) UTI complicating pregnancy 03/28/2017  . Encounter for supervision of low-risk pregnancy 03/17/2017    Additional problems: none     Discharge diagnosis: Term Pregnancy Delivered                                                                                                Post partum procedures:none  Complications: None  Hospital course:  Onset of Labor With Vaginal Delivery     26 y.o. yo G3P3003 at [redacted]w[redacted]d was admitted in Active Labor on 09/27/2017. Patient had an uncomplicated labor course as follows:  Membrane Rupture Time/Date: 12:03 PM ,09/27/2017   Intrapartum Procedures: Episiotomy: None [1]                                         Lacerations:  None [1]  Patient had a delivery of a Viable infant. 09/27/2017  Information for the patient's newborn:  Kelsha, Older Girl Tomeka [[id number]]       Pateint had an uncomplicated postpartum course.  She is ambulating, tolerating a regular diet, passing flatus, and urinating well. Patient is discharged home in stable condition on 09/28/17.   Physical exam  Vitals:   09/27/17 2006 09/28/17 0412  BP: 105/68 (!) 97/57  Pulse: 65 66  Resp: 16 16  Temp: 97.8 F (36.6 C) 98 F (36.7 C)  SpO2: 99% 99%    General: alert, cooperative and no distress Lochia: appropriate Uterine Fundus: firm Incision: N/A DVT Evaluation: No evidence of DVT seen on physical exam.  Labs: Results for  orders placed or performed during the hospital encounter of 09/27/17 (from the past 24 hour(s))  CBC     Status: Abnormal   Collection Time: 09/27/17  7:50 AM  Result Value Ref Range   WBC 13.5 (H) 4.0 - 10.5 K/uL   RBC 3.81 (L) 3.87 - 5.11 MIL/uL   Hemoglobin 11.3 (L) 12.0 - 15.0 g/dL   HCT 78.4 (L) 69.6 - 29.5 %   MCV 84.3 78.0 - 100.0 fL   MCH 29.7 26.0 - 34.0 pg   MCHC 35.2 30.0 - 36.0 g/dL   RDW 28.4 13.2 - 44.0 %   Platelets 242 150 - 400 K/uL  RPR     Status: None   Collection Time: 09/27/17  7:50 AM  Result Value Ref Range   RPR Ser Ql Non Reactive Non Reactive  Type and screen [facility]     Status: None  Collection Time: 09/27/17  7:50 AM  Result Value Ref Range   ABO/RH(D) AB POS    Antibody Screen NEG    Sample Expiration 09/30/2017      Discharge instruction: per After Visit Summary and "Baby and Me Booklet".  After visit meds:  No Known Allergies  Allergies as of 09/28/2017   No Known Allergies     Medication List    STOP taking these medications   COMFORT FIT MATERNITY SUPP MED Misc   ondansetron 8 MG disintegrating tablet Commonly known as:  ZOFRAN ODT   ranitidine 150 MG tablet Commonly known as:  ZANTAC   zolpidem 5 MG tablet Commonly known as:  AMBIEN     TAKE these medications   oxyCODONE-acetaminophen 5-325 MG tablet Commonly known as:  PERCOCET/ROXICET Take 1 tablet by mouth every 4 (four) hours as needed for moderate pain or severe pain.   prenatal vitamin w/FE, FA 27-1 MG Tabs tablet Take 1 tablet by mouth daily at 12 noon.        Diet: routine diet  Activity: Advance as tolerated. Pelvic rest for 6 weeks.   Outpatient follow up:4 weeks   Postpartum contraception: Depo Provera  Newborn Data: APGAR (1 MIN): 8   APGAR (5 MINS): 9     Baby Feeding: Breast Disposition:home with mother  Rolm Bookbinder, DO  09/28/2017

## 2017-09-28 NOTE — Progress Notes (Signed)
  CSW received consult due to score greater than 9, or positive for SI on [place] Depression Screen (MOB's EDPS was 11).    When CSW arrived, MOB was resting in bed, FOB was standing observing infant in bassinet. CSW introduced herself and explained CSW's role. MOB was polite and receptive to meeting with CSW. CSW provided education regarding Baby Blues vs PMADs. CSW encouraged MOB to evaluate her mental health throughout the postpartum period with the use of the New Mom Checklist developed by Postpartum [facility] and notify a medical professional if symptoms arise.  CSW assessed for safety and MOB denied SI and HI.    CSW identifies no further need for intervention at this time or barriers to discharge.  Blaine Hamper, MSW, LCSW Clinical Social Work [phone]

## 2017-09-28 NOTE — Discharge Instructions (Signed)

## 2017-09-28 NOTE — Lactation Note (Signed)
This note was copied from a baby's chart. Lactation Consultation Note  Patient Name: Lori Cardenas [id number] Date: 09/28/2017 Reason for consult: Initial assessment   Initial assessment with Exp BF mom of 27 hour old infant. Infant with 6 BF for 15-30 minutes, 1 BF attempt, 5 voids and 4 stools in the last 24 hours. Infant weight 7 lb 12.3 oz with 3% weight loss since birth. Mom is from [place], dad from [place]. Mom speaks good [place] and denied need for interpreter.   Mom reports BF is going well. Mom reports her nipples are tender with initial latch. Enc mom to apply EBM to nipples post BF. Mom reports she is able to hand express colostrum. Mom BF her 2 older children for 1 year and 1.5 years. Mom with soft compressible breasts and areola with everted nipples.   Infant was crying when LC entered room. Dad handed infant to mom and mom latched her to the breast independently. Infant suckled a few minutes and then stooled. Diaper was changed and infant was dressed for d/c. She then fell asleep.   Reviewed BF basics, positioning, pillow support, cluster feeding, feeding 8-12 x in 24 hours at first feeding cues, hand expression, infant stomach size, supply and demand and milk coming to volume. Mom was given manual pump with instructions for use in assembling, disassembling and cleaning of pump parts. Mom is a [employer] client and spoke with them in the hospital.   Reviewed I/O, Signs of dehydration in the infant, Engorgement prevention/treatment, and breast milk handling and storage.   BF Resources handout and LC Brochure given, mom informed of IP/OP Services, BF Support groups and LC phone #. Enc mom to call with any questions/concerns. Parents voice no questions/concerns at this time.    Maternal Data Formula Feeding for Exclusion: No Has patient been taught Hand Expression?: Yes Does the patient have breastfeeding experience prior to this delivery?: Yes  Feeding Feeding Type: Breast Fed Length of  feed: 5 min  LATCH Score Latch: Grasps breast easily, tongue down, lips flanged, rhythmical sucking.  Audible Swallowing: A few with stimulation  Type of Nipple: Everted at rest and after stimulation  Comfort (Breast/Nipple): Soft / non-tender  Hold (Positioning): No assistance needed to correctly position infant at breast.  LATCH Score: 9  Interventions Interventions: Breast feeding basics reviewed;Support pillows;Assisted with latch;Position options;Skin to skin;Expressed milk  Lactation Tools Discussed/Used WIC Program: Yes Pump Review: Setup, frequency, and cleaning;Milk Storage Initiated by:: Noralee Stain, RN, IBCLC Date initiated:: 09/28/17   Consult Status Consult Status: Complete    Silas Flood Christiano Blandon 09/28/2017, 4:01 PM

## 2017-10-01 ENCOUNTER — Encounter: Payer: Medicaid Other | Admitting: Student

## 2017-10-01 ENCOUNTER — Other Ambulatory Visit: Payer: Medicaid Other

## 2017-10-04 ENCOUNTER — Inpatient Hospital Stay (HOSPITAL_COMMUNITY): Admission: RE | Admit: 2017-10-04 | Payer: Medicaid Other | Source: Ambulatory Visit

## 2017-10-16 ENCOUNTER — Encounter: Payer: Self-pay | Admitting: Medical

## 2017-10-16 ENCOUNTER — Encounter: Payer: Self-pay | Admitting: *Deleted

## 2017-10-16 ENCOUNTER — Ambulatory Visit (INDEPENDENT_AMBULATORY_CARE_PROVIDER_SITE_OTHER): Payer: Medicaid Other | Admitting: Medical

## 2017-10-16 DIAGNOSIS — Z3009 Encounter for other general counseling and advice on contraception: Secondary | ICD-10-CM

## 2017-10-16 DIAGNOSIS — Z3202 Encounter for pregnancy test, result negative: Secondary | ICD-10-CM | POA: Diagnosis not present

## 2017-10-16 DIAGNOSIS — Z3049 Encounter for surveillance of other contraceptives: Secondary | ICD-10-CM | POA: Diagnosis not present

## 2017-10-16 LAB — POCT PREGNANCY, URINE: Preg Test, Ur: NEGATIVE

## 2017-10-16 MED ORDER — MEDROXYPROGESTERONE ACETATE 150 MG/ML IM SUSP
150.0000 mg | INTRAMUSCULAR | Status: DC
  Administered 2017-10-16: 150 mg via INTRAMUSCULAR

## 2017-10-16 NOTE — Progress Notes (Signed)
Subjective:     Lori Cardenas is a 26 y.o. female who presents for a postpartum visit. She is 2 weeks 5 days postpartum following a spontaneous vaginal delivery. I have fully reviewed the prenatal and intrapartum course. The delivery was at 40 gestational weeks. Outcome: spontaneous vaginal delivery. Anesthesia: IV sedation. Postpartum course has been normal. Baby's course has been normal. Baby is feeding by breast. Bleeding pink. Bowel function is normal. Bladder function is normal. Patient is not sexually active. Contraception method is abstrinence. Postpartum depression screening: negative.  The following portions of the patient's history were reviewed and updated as appropriate: allergies, current medications, past family history, past medical history, past social history, past surgical history and problem list.  Review of Systems Pertinent items are noted in HPI.   Objective:    BP 114/79   Pulse 72   Wt 117 lb 14.4 oz (53.5 kg)   Breastfeeding? Yes   BMI 23.03 kg/m   General:  alert and cooperative   Breasts:  not performed  Lungs: clear to auscultation bilaterally  Heart:  regular rate and rhythm, S1, S2 normal, no murmur, click, rub or gallop  Abdomen: soft, non-tender; bowel sounds normal; no masses,  no organomegaly   Vulva:  not evaluated  Vagina: not evaluated  Cervix:  not evaluated  Corpus: not examined  Adnexa:  not evaluated  Rectal Exam: Not performed.        Assessment:     Normal postpartum exam. Pap smear not done at today's visit.  Had normal pap smear in pregnancy.  Birth control counseling Unwanted fertility   Plan:    1. Contraception: Depo-Provera injections 2. Patient advised to check with Medicaid office to see if she will continue to have coverage after pregnancy Medicaid expires, if not [facility] Depo Provera [facility] 3. Ibuprofen PRN for cramping advised 4. Follow up in: 12 weeks for Depo Provera or sooner as needed.     Marny LowensteinWenzel, Mikylah Ackroyd N, PA-C 10/16/2017 10:33 AM

## 2017-10-16 NOTE — Patient Instructions (Signed)

## 2018-01-01 ENCOUNTER — Ambulatory Visit: Payer: Medicaid Other

## 2018-01-29 ENCOUNTER — Other Ambulatory Visit: Payer: Self-pay | Admitting: Internal Medicine

## 2018-01-29 DIAGNOSIS — Z3A01 Less than 8 weeks gestation of pregnancy: Secondary | ICD-10-CM

## 2018-05-26 ENCOUNTER — Other Ambulatory Visit (INDEPENDENT_AMBULATORY_CARE_PROVIDER_SITE_OTHER): Payer: Medicaid Other

## 2018-05-26 DIAGNOSIS — R300 Dysuria: Secondary | ICD-10-CM | POA: Diagnosis not present

## 2018-05-26 LAB — POCT URINALYSIS DIPSTICK
Bilirubin, UA: NEGATIVE
Blood, UA: NEGATIVE
GLUCOSE UA: NEGATIVE
Ketones, UA: NEGATIVE
LEUKOCYTES UA: NEGATIVE
Nitrite, UA: NEGATIVE
Protein, UA: NEGATIVE
SPEC GRAV UA: 1.01 (ref 1.010–1.025)
Urobilinogen, UA: 0.2 E.U./dL
pH, UA: 6 (ref 5.0–8.0)

## 2018-05-28 ENCOUNTER — Encounter: Payer: Self-pay | Admitting: Internal Medicine

## 2018-05-28 ENCOUNTER — Ambulatory Visit: Payer: Medicaid Other | Admitting: Internal Medicine

## 2018-05-28 VITALS — BP 122/82 | HR 78 | Resp 12 | Ht 60.0 in | Wt 132.0 lb

## 2018-05-28 DIAGNOSIS — N898 Other specified noninflammatory disorders of vagina: Secondary | ICD-10-CM

## 2018-05-28 LAB — POCT WET PREP WITH KOH
KOH PREP POC: NEGATIVE
RBC Wet Prep HPF POC: NEGATIVE
TRICHOMONAS UA: NEGATIVE
Yeast Wet Prep HPF POC: NEGATIVE

## 2018-05-28 MED ORDER — METRONIDAZOLE 0.75 % VA GEL: VAGINAL | 0 refills | Status: DC

## 2018-05-28 NOTE — Progress Notes (Signed)
   Subjective:    Patient ID: Lori Cardenas, female    DOB: 10/13/1991, 27 y.o.   MRN: [id number]  HPI   Started with vaginal discharge with vaginal itching and bad odor.  Has not been on any antibiotics recently.  She is not aware of her husband having anything.   She has had this before.  She is not certain if was BV.  Her urine taken yesterday was normal on UA.  This was done as she initially just complained of burning on urination, but later found out she has an external vaginal rash and symptoms above.  Is having some bilateral pelvic discomfort.  She is having very scant one day periods and then done.  LMP was about 3 weeks ago.  Her cramping associated with periods is different that what she is having now. She is nursing her 547 month old daughter.  No outpatient medications have been marked as taking for the 05/28/18 encounter (Office Visit) with Julieanne MansonMulberry, Serenitee Fuertes, MD.   Current Facility-Administered Medications for the 05/28/18 encounter (Office Visit) with Julieanne MansonMulberry, Jonita Hirota, MD  Medication  . medroxyPROGESTERone (DEPO-PROVERA) injection 150 mg    No Known Allergies    Review of Systems     Objective:   Physical Exam NAD Lungs:  CTA CV:  RRR without murmur or rub Abd:  S, NT, No HSM or mass. + BS.  No flank tenderness. GU:  Mild inflammation of external genitalia/vaginal introitus more so.  Thin yellow white discharge. No odor.  No cervical lesion.  No CMT.  No uterine or adnexal mass or tenderness.       Assessment & Plan:  Vaginal Discharge:  Send GC/Chlamydia.  As nursing, will treat with vaginal Metrogel 0.75% twice daily for 5 days.   She is to call if her symptoms do not resolve.

## 2018-06-01 LAB — GC/CHLAMYDIA PROBE AMP
Chlamydia trachomatis, NAA: NEGATIVE
Neisseria gonorrhoeae by PCR: NEGATIVE

## 2018-06-15 ENCOUNTER — Other Ambulatory Visit (INDEPENDENT_AMBULATORY_CARE_PROVIDER_SITE_OTHER): Payer: Medicaid Other

## 2018-06-15 DIAGNOSIS — R300 Dysuria: Secondary | ICD-10-CM

## 2018-06-15 DIAGNOSIS — N898 Other specified noninflammatory disorders of vagina: Secondary | ICD-10-CM | POA: Diagnosis not present

## 2018-06-15 LAB — POCT URINALYSIS DIPSTICK
Bilirubin, UA: NEGATIVE
GLUCOSE UA: NEGATIVE
KETONES UA: NEGATIVE
LEUKOCYTES UA: NEGATIVE
Nitrite, UA: NEGATIVE
Protein, UA: POSITIVE — AB
SPEC GRAV UA: 1.015 (ref 1.010–1.025)
Urobilinogen, UA: 0.2 E.U./dL
pH, UA: 7.5 (ref 5.0–8.0)

## 2018-06-15 LAB — POCT WET PREP WITH KOH
KOH PREP POC: NEGATIVE
RBC Wet Prep HPF POC: NEGATIVE
TRICHOMONAS UA: NEGATIVE
Yeast Wet Prep HPF POC: POSITIVE

## 2018-06-15 MED ORDER — METRONIDAZOLE 0.75 % VA GEL: VAGINAL | 0 refills | Status: DC

## 2018-06-15 MED ORDER — FLUCONAZOLE 150 MG PO TABS: ORAL_TABLET | ORAL | 0 refills | Status: DC

## 2018-06-15 NOTE — Progress Notes (Signed)
Here as a walk in after completing vaginal Metrogel for BV twice daily for 5 days.  States about 3 days later, developed white-yellow discharge, mild odor and significant itching again. Some low back pain, no suprapubic pain. No fever.   She is still nursing her youngest daughter.  She performed a self swab for wet prep  Wet prep + for mild BV and what appears to be yeast, though cannot see the actual budding  A/P: What appears to be mild BV and yeast vaginitis by wet prep:  To take Fluconazole 150 mg today and start Metronidazole gel twice daily for 5 more days.  To repeat Fluconazole the day after completes the Metronidazole.  UA with odor and trace to mild blood and protein:  Sending for culture as well.

## 2018-06-17 LAB — URINE CULTURE

## 2018-07-26 ENCOUNTER — Encounter: Payer: Self-pay | Admitting: Internal Medicine

## 2018-07-26 ENCOUNTER — Ambulatory Visit: Payer: Medicaid Other | Admitting: Internal Medicine

## 2018-07-26 VITALS — BP 122/70 | HR 82 | Resp 12 | Ht 60.0 in | Wt 136.0 lb

## 2018-07-26 DIAGNOSIS — N898 Other specified noninflammatory disorders of vagina: Secondary | ICD-10-CM

## 2018-07-26 DIAGNOSIS — Z3009 Encounter for other general counseling and advice on contraception: Secondary | ICD-10-CM

## 2018-07-26 LAB — POCT WET PREP WITH KOH
CLUE CELLS WET PREP PER HPF POC: POSITIVE
KOH Prep POC: NEGATIVE
RBC WET PREP PER HPF POC: NEGATIVE
TRICHOMONAS UA: NEGATIVE

## 2018-07-26 MED ORDER — FLUCONAZOLE 150 MG PO TABS: ORAL_TABLET | ORAL | 0 refills | Status: DC

## 2018-07-26 MED ORDER — METRONIDAZOLE 0.75 % VA GEL: VAGINAL | 0 refills | Status: DC

## 2018-07-26 NOTE — Patient Instructions (Signed)
Call [facility] for IUD placement visit.   If is too far out, call and we can discuss combination pill

## 2018-07-26 NOTE — Progress Notes (Signed)
   Subjective:    Patient ID: Lori Cardenas, female    DOB: 04/29/1991, 27 y.o.   MRN: [id number]  HPI   Here for family planning:  [facility], but decided not to continue when she was due July 23-30th.   She was having vaginal discharge and itching 6/14  And 7/2 for which she was initially treated with Metrogel and the latter date a walk in and treated for both BV and yeast.   End of 2017 had Nexplanon placed, but developed spotting and back pain.  Was then place on Sprintec 28 day with bleeding and cramping issues as well. She is not interested in these methods due to these side effects. Discussed IUD and perhaps trying another combination OCP. She and her husband are not interested yet in a permanent solution.   At end of visit, she states she has redeveloped discharge and itching again about 1.5 weeks ago.  She has not had her depo provera as above.  She has had similar problems with   The current discharge does have an odor and beige.  No fevers.  No pelvic pain, though sometimes has some low back pain with this.    No outpatient medications have been marked as taking for the 07/26/18 encounter (Office Visit) with Julieanne MansonMulberry, Rmoni Keplinger, MD.   Current Facility-Administered Medications for the 07/26/18 encounter (Office Visit) with Julieanne MansonMulberry, Tocarra Gassen, MD  Medication  . medroxyPROGESTERone (DEPO-PROVERA) injection 150 mg    No Known Allergies Review of Systems     Objective:   Physical Exam NAD Self swab for wet prep:  + clue cells and whiff as well as some budding yeat      Assessment & Plan:  Family planning:  Patient [facility] possible [facility] of IUD as she seems to have had problems with other forms of birth control If there will be along delay, she can call and would be happy to try a different combination OCP than the Sprintec  BV and vaginal yeast:  Metrogel bid for 5 days followed by 2 days of once daily  Fluconazole 150 mg

## 2018-09-27 ENCOUNTER — Ambulatory Visit: Payer: Medicaid Other | Admitting: Internal Medicine

## 2018-09-27 ENCOUNTER — Encounter: Payer: Self-pay | Admitting: Internal Medicine

## 2018-09-27 VITALS — BP 102/80 | HR 78 | Resp 12 | Ht 60.0 in | Wt 135.0 lb

## 2018-09-27 DIAGNOSIS — N898 Other specified noninflammatory disorders of vagina: Secondary | ICD-10-CM | POA: Diagnosis not present

## 2018-09-27 LAB — POCT WET PREP WITH KOH
KOH Prep POC: NEGATIVE
TRICHOMONAS UA: NEGATIVE
Yeast Wet Prep HPF POC: NEGATIVE

## 2018-09-27 MED ORDER — METRONIDAZOLE 0.75 % VA GEL: VAGINAL | 0 refills | Status: DC

## 2018-09-27 NOTE — Progress Notes (Signed)
   Subjective:    Patient ID: Lori Cardenas, female    DOB: 1991-11-14, 27 y.o.   MRN: [id number]  HPI   Having vaginal discharge with odor.  Liquidy.. Not sure if a color.  Significant itching and pain with intercourse.   She is not aware if [place] Dula, her husband is having any issues.  Patient preferred a self swab be performed  She obtained sample with obvious blood.  States period just started yesterday.    Current Meds  Medication Sig  . levonorgestrel (MIRENA) 20 MCG/24HR IUD 1 each by Intrauterine route once.    No Known Allergies Review of Systems     Objective:   Physical Exam   NAD  Wet prep + for RBCs, clue cells, bacteria.  Negative Trich, Yeast, KOH, whiff     Assessment & Plan:  Vaginal discharge:  Has been checked for this several times over recent months.   Treat for BV:  As she is nursing will treat with topical metrogel twice daily for 5 days. To call if itching worsens and will treate tiwh Fluconazole.

## 2018-11-29 ENCOUNTER — Encounter: Payer: Self-pay | Admitting: Internal Medicine

## 2018-11-29 ENCOUNTER — Ambulatory Visit: Payer: Medicaid Other | Admitting: Internal Medicine

## 2018-11-29 VITALS — BP 122/78 | HR 72 | Resp 12 | Ht 60.0 in | Wt 133.0 lb

## 2018-11-29 DIAGNOSIS — B9689 Other specified bacterial agents as the cause of diseases classified elsewhere: Secondary | ICD-10-CM | POA: Diagnosis not present

## 2018-11-29 DIAGNOSIS — N760 Acute vaginitis: Secondary | ICD-10-CM

## 2018-11-29 DIAGNOSIS — F439 Reaction to severe stress, unspecified: Secondary | ICD-10-CM

## 2018-11-29 DIAGNOSIS — M25571 Pain in right ankle and joints of right foot: Secondary | ICD-10-CM

## 2018-11-29 LAB — POCT WET PREP WITH KOH
KOH Prep POC: NEGATIVE
RBC Wet Prep HPF POC: NEGATIVE
Trichomonas, UA: NEGATIVE
Yeast Wet Prep HPF POC: NEGATIVE

## 2018-11-29 MED ORDER — METRONIDAZOLE 0.75 % VA GEL: VAGINAL | 0 refills | Status: DC

## 2018-11-29 NOTE — Progress Notes (Signed)
   Subjective:    Patient ID: Lori Cardenas, female    DOB: 03/14/1991, 27 y.o.   MRN: [id number]  HPI   1.  Vaginal discharge with odor restarted some time ago.  Does respond to treatment, but then recurs.  She is having vaginal itching as well.  She thinks this has been for the last 1.5 weeks.   She does not recall being on any antibiotics prior.   She would prefer a self swab to be checked microscopically.    2. Left low back, knee and ankle bothering her.  Started about 1 week ago.  She does not recall an injury.  She is taking GED classes at [employer] and is walking more.  Perhaps that set this off. She has taken 400 mg of Advil without much improvement.  Maybe twice daily.    Current Meds  Medication Sig  . levonorgestrel (MIRENA) 20 MCG/24HR IUD 1 each by Intrauterine route once.   No Known Allergies    Review of Systems     Objective:   Physical Exam  NAD Right low back:  Tender over musculature just above posterior pelvic rim. Right knee without swelling or erythema.  Tender over medial joint margin mildly so and collateral ligament.  Increased discomfort with stress of medial collateral ligament.  Other ligament stressing stable without pain.  Good extension against opposing force. Right achilles tendon tender to insertion.  Wet prep:  + for BV    Assessment & Plan:  1.  Recurrent BV:  Still nursing her youngest.  Plans to stop in another month.  Will call if does so and symptoms recur.  Can treat for 6 months with suppressive dosing to see if will clear.  2.  Right ankle pain, likely causing change in gait with resulting knee and low back pain:  Ibuprofen or Aleve, dosing in patient information. Patient brings up this seems to recur when she is stressed.  Would like counseling with one of our MSW interns.  Referral to [facility] or [facility] for counseling/stress reduction.

## 2018-11-29 NOTE — Patient Instructions (Signed)
Advil 600 mg twice daily OR Aleve 225 mg 2 tabs twice daily for 2 weeks for right leg pain

## 2018-12-14 ENCOUNTER — Telehealth: Payer: Self-pay | Admitting: Internal Medicine

## 2018-12-14 NOTE — Telephone Encounter (Signed)
Patient called stating she needs to have metroNIDAZOLE (METROGEL) 0.75 % vaginal gel sent over to a pharmacy where she can get it cheaper since pt. Only has [facility] [employer] and it is not covered.  Please advise

## 2018-12-16 MED ORDER — METRONIDAZOLE 0.75 % VA GEL: VAGINAL | 0 refills | Status: DC

## 2018-12-16 NOTE — Telephone Encounter (Signed)
Patient did not get orange card renewed to go to [facility] for RX. Patient still breast feeding so no other alternatives per Dr. Delrae Alfred. Found goodRx coupon for $26 @ [facility]. Spoke with patient. Patient agreed to get Rx from [facility] and will stop by the office this afternoon to get coupon. Patient would like Rx sent to [facility] on [address].  To Dr. Delrae Alfred to send in Rx

## 2019-10-11 ENCOUNTER — Ambulatory Visit (INDEPENDENT_AMBULATORY_CARE_PROVIDER_SITE_OTHER): Payer: Medicaid Other

## 2019-10-11 ENCOUNTER — Other Ambulatory Visit: Payer: Self-pay

## 2019-10-11 DIAGNOSIS — Z23 Encounter for immunization: Secondary | ICD-10-CM | POA: Diagnosis not present

## 2021-08-02 LAB — GLUCOSE, POCT (MANUAL RESULT ENTRY): POC Glucose: 121 mg/dl — AB (ref 70–99)

## 2022-01-08 ENCOUNTER — Encounter (HOSPITAL_COMMUNITY): Payer: Self-pay

## 2022-01-08 ENCOUNTER — Ambulatory Visit (INDEPENDENT_AMBULATORY_CARE_PROVIDER_SITE_OTHER): Payer: Self-pay | Admitting: Internal Medicine

## 2022-01-08 ENCOUNTER — Emergency Department (HOSPITAL_COMMUNITY): Payer: Medicaid Other

## 2022-01-08 ENCOUNTER — Other Ambulatory Visit: Payer: Self-pay

## 2022-01-08 ENCOUNTER — Emergency Department (HOSPITAL_COMMUNITY)
Admission: EM | Admit: 2022-01-08 | Discharge: 2022-01-09 | Disposition: A | Discharge status: Home / Self Care | Payer: Medicaid Other | Attending: Emergency Medicine | Admitting: Emergency Medicine

## 2022-01-08 ENCOUNTER — Encounter: Payer: Self-pay | Admitting: Internal Medicine

## 2022-01-08 VITALS — BP 118/86 | HR 96 | Resp 20 | Ht 60.5 in | Wt 136.0 lb

## 2022-01-08 DIAGNOSIS — Y92410 Unspecified street and highway as the place of occurrence of the external cause: Secondary | ICD-10-CM | POA: Diagnosis not present

## 2022-01-08 DIAGNOSIS — R4182 Altered mental status, unspecified: Secondary | ICD-10-CM

## 2022-01-08 DIAGNOSIS — S060X0A Concussion without loss of consciousness, initial encounter: Secondary | ICD-10-CM | POA: Insufficient documentation

## 2022-01-08 DIAGNOSIS — R278 Other lack of coordination: Secondary | ICD-10-CM

## 2022-01-08 DIAGNOSIS — M25512 Pain in left shoulder: Secondary | ICD-10-CM | POA: Insufficient documentation

## 2022-01-08 DIAGNOSIS — M542 Cervicalgia: Secondary | ICD-10-CM | POA: Insufficient documentation

## 2022-01-08 DIAGNOSIS — R1032 Left lower quadrant pain: Secondary | ICD-10-CM | POA: Diagnosis not present

## 2022-01-08 DIAGNOSIS — R531 Weakness: Secondary | ICD-10-CM | POA: Insufficient documentation

## 2022-01-08 DIAGNOSIS — R200 Anesthesia of skin: Secondary | ICD-10-CM | POA: Insufficient documentation

## 2022-01-08 DIAGNOSIS — S0990XA Unspecified injury of head, initial encounter: Secondary | ICD-10-CM | POA: Diagnosis present

## 2022-01-08 DIAGNOSIS — M5126 Other intervertebral disc displacement, lumbar region: Secondary | ICD-10-CM | POA: Diagnosis not present
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
## 2022-01-09 ENCOUNTER — Emergency Department (HOSPITAL_COMMUNITY): Payer: Medicaid Other

## 2022-01-09 LAB — COMPREHENSIVE METABOLIC PANEL
ALT: 37 U/L (ref 0–44)
AST: 24 U/L (ref 15–41)
Albumin: 4.2 g/dL (ref 3.5–5.0)
Alkaline Phosphatase: 74 U/L (ref 38–126)
Anion gap: 9 (ref 5–15)
BUN: 11 mg/dL (ref 6–20)
CO2: 25 mmol/L (ref 22–32)
Calcium: 9.5 mg/dL (ref 8.9–10.3)
Chloride: 102 mmol/L (ref 98–111)
Creatinine, Ser: 0.68 mg/dL (ref 0.44–1.00)
GFR, Estimated: >60 mL/min (ref >60)
Glucose, Bld: 91 mg/dL (ref 70–99)
Potassium: 3.6 mmol/L (ref 3.5–5.1)
Sodium: 136 mmol/L (ref 135–145)
Total Bilirubin: 0.6 mg/dL (ref 0.3–1.2)
Total Protein: 7.4 g/dL (ref 6.5–8.1)

## 2022-01-09 LAB — CBC WITH DIFFERENTIAL/PLATELET
Abs Immature Granulocytes: 0.03 10*3/uL (ref 0.00–0.07)
Basophils Absolute: 0 10*3/uL (ref 0.0–0.1)
Basophils Relative: 0 %
Eosinophils Absolute: 0.1 10*3/uL (ref 0.0–0.5)
Eosinophils Relative: 1 %
HCT: 39.5 % (ref 36.0–46.0)
Hemoglobin: 13.1 g/dL (ref 12.0–15.0)
Immature Granulocytes: 0 %
Lymphocytes Relative: 30 %
Lymphs Abs: 2.9 10*3/uL (ref 0.7–4.0)
MCH: 29.3 pg (ref 26.0–34.0)
MCHC: 33.2 g/dL (ref 30.0–36.0)
MCV: 88.4 fL (ref 80.0–100.0)
Monocytes Absolute: 0.6 10*3/uL (ref 0.1–1.0)
Monocytes Relative: 6 %
Neutro Abs: 6 10*3/uL (ref 1.7–7.7)
Neutrophils Relative %: 63 %
Platelets: 237 10*3/uL (ref 150–400)
RBC: 4.47 MIL/uL (ref 3.87–5.11)
RDW: 12.3 % (ref 11.5–15.5)
WBC: 9.7 10*3/uL (ref 4.0–10.5)
nRBC: 0 % (ref 0.0–0.2)

## 2022-01-09 LAB — I-STAT BETA HCG BLOOD, ED (MC, WL, AP ONLY): I-stat hCG, quantitative: <5 m[IU]/mL (ref <5)

## 2022-01-09 MED ORDER — GADOBUTROL 1 MMOL/ML IV SOLN
6.0000 mL | Freq: Once | INTRAVENOUS | Status: AC | PRN
  Administered 2022-01-09: 6 mL via INTRAVENOUS

## 2022-01-09 MED ORDER — PREDNISONE 20 MG PO TABS
60.0000 mg | ORAL_TABLET | Freq: Once | ORAL | Status: AC
Start: 2022-01-09 — End: 2022-01-09
  Administered 2022-01-09: 60 mg via ORAL
  Filled 2022-01-09: qty 3

## 2022-01-09 MED ORDER — METHYLPREDNISOLONE 4 MG PO TBPK: ORAL_TABLET | ORAL | 0 refills | Status: DC

## 2022-01-09 MED ORDER — LORAZEPAM 2 MG/ML IJ SOLN
1.0000 mg | Freq: Once | INTRAMUSCULAR | Status: AC | PRN
  Administered 2022-01-09: 1 mg via INTRAVENOUS
  Filled 2022-01-09: qty 1

## 2022-01-09 NOTE — ED Notes (Signed)
Pt went to [employer] with her caretaker

## 2022-01-09 NOTE — Discharge Instructions (Addendum)
You have been diagnosed with a concussion and with a protruding lumbar disc.  I am recommending follow-up with orthopedics for the protruding disc and with sports medicine for the concussion.  I have prescribed a Dosepak to help with inflammation.  I also recommend taking anti-inflammatories over-the-counter.  Return for worsening symptoms including loss of consciousness or inability to ambulate

## 2022-01-09 NOTE — ED Notes (Signed)
Patient transported to MRI 

## 2022-01-09 NOTE — ED Notes (Signed)
Pt verbalizes understanding of discharge instructions. Opportunity for questions and answers were provided. Pt discharged from the ED.   ?
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
## 2022-01-20 ENCOUNTER — Telehealth: Payer: Self-pay

## 2022-01-20 NOTE — Telephone Encounter (Signed)
Lori Cardenas called to ask for any recommendations to help with patients continued back pain and migraines. The medications that she was given in the ED have run out. She said that the muscle relaxer helped her, but was unable to confirm the medication name. She has also been unable to get her scheduled with a follow up with Dr Delrae Alfred as her availability is limited.

## 2022-01-21 ENCOUNTER — Other Ambulatory Visit: Payer: Self-pay

## 2022-01-21 ENCOUNTER — Ambulatory Visit (INDEPENDENT_AMBULATORY_CARE_PROVIDER_SITE_OTHER): Payer: Self-pay | Admitting: Family Medicine

## 2022-01-21 VITALS — BP 136/86 | HR 87 | Ht 60.0 in | Wt 147.2 lb

## 2022-01-21 DIAGNOSIS — R42 Dizziness and giddiness: Secondary | ICD-10-CM

## 2022-01-21 DIAGNOSIS — M62838 Other muscle spasm: Secondary | ICD-10-CM

## 2022-01-21 DIAGNOSIS — S060X0A Concussion without loss of consciousness, initial encounter: Secondary | ICD-10-CM

## 2022-01-21 DIAGNOSIS — M62830 Muscle spasm of back: Secondary | ICD-10-CM

## 2022-01-21 DIAGNOSIS — G4701 Insomnia due to medical condition: Secondary | ICD-10-CM

## 2022-01-21 DIAGNOSIS — G44321 Chronic post-traumatic headache, intractable: Secondary | ICD-10-CM

## 2022-01-21 MED ORDER — NORTRIPTYLINE HCL 25 MG PO CAPS: 25.0000 mg | ORAL_CAPSULE | Freq: Every day | ORAL | 2 refills | Status: DC

## 2022-01-21 NOTE — Patient Instructions (Addendum)
Thank you for coming in today.   Check back in 2 weeks.   Try meclezine as needed for dizzy.    Use nortryptline at bedtime for headache and for sleep   Fill out that paperwork.
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
## 2022-01-22 ENCOUNTER — Encounter: Payer: Self-pay | Admitting: Internal Medicine

## 2022-01-22 ENCOUNTER — Ambulatory Visit (INDEPENDENT_AMBULATORY_CARE_PROVIDER_SITE_OTHER): Payer: Medicaid Other | Admitting: Internal Medicine

## 2022-01-22 VITALS — BP 112/90 | HR 84 | Resp 16 | Ht 60.0 in | Wt 142.0 lb

## 2022-01-22 DIAGNOSIS — G4700 Insomnia, unspecified: Secondary | ICD-10-CM

## 2022-01-22 DIAGNOSIS — F439 Reaction to severe stress, unspecified: Secondary | ICD-10-CM

## 2022-01-22 DIAGNOSIS — M79605 Pain in left leg: Secondary | ICD-10-CM

## 2022-01-22 DIAGNOSIS — S060X0S Concussion without loss of consciousness, sequela: Secondary | ICD-10-CM

## 2022-01-22 DIAGNOSIS — M549 Dorsalgia, unspecified: Secondary | ICD-10-CM

## 2022-01-22 NOTE — Progress Notes (Unsigned)
° ° °  Subjective:    Patient ID: Lori Cardenas, female   DOB: [birth year], 31 y.o.   MRN: [id number]   HPI  Saw Dr. Clementeen Graham with Sports Medicine yesterday with significant plan to work on improved sleep for healing and for headache control.  He would like for her also to get PT for vestibular  Couldn't sleep last night due to back and left leg pain--did not take ibuprofen until middle of the night  Not taking muscle releaxtant, but taking Meclinzine--sleeps for 30 min afterward.  Ovarian cyst?   Current Meds  Medication Sig   ibuprofen (ADVIL) 800 MG tablet Take 800 mg by mouth every 8 (eight) hours as needed for moderate pain. 1 tab 3 times daily for 5 days   levonorgestrel (MIRENA) 20 MCG/24HR IUD 1 each by Intrauterine route once.   meclizine (ANTIVERT) 25 MG tablet Take 25 mg by mouth as needed for dizziness.   nortriptyline (PAMELOR) 25 MG capsule Take 1 capsule (25 mg total) by mouth at bedtime.   No Known Allergies   Review of Systems    Objective:   BP 112/90 (BP Location: Right Arm, Patient Position: Sitting, Cuff Size: Normal)    Pulse 84    Resp 16    Ht 5' (1.524 m)    Wt 142 lb (64.4 kg)    BMI 27.73 kg/m   Physical Exam   Assessment & Plan   ***

## 2022-01-22 NOTE — Patient Instructions (Signed)
[facility]  Address: [address] [place], [place], [place] [address]  Phone: [phone] Please walk into the clinic between 9 a.m. and 1 p.m. Mon-Fri to establish Virtual visits are available   Warm bath and reading before bed. If unable to get to sleep in 20 minutes, get out of bed, go somewhere calm and read until you are sleepy, then back to bed. If awaken and unable to get back to sleep in 20 minutes--read as above until sleepy. Physical activity with a friend every day--gradually work up to 1 hour.  Do not exercise close to bedtime. No napping

## 2022-01-27 NOTE — Telephone Encounter (Signed)
Patient seen by Dr Mulberry  

## 2022-02-03 ENCOUNTER — Ambulatory Visit (INDEPENDENT_AMBULATORY_CARE_PROVIDER_SITE_OTHER): Payer: Self-pay | Admitting: Family Medicine

## 2022-02-03 ENCOUNTER — Ambulatory Visit: Payer: Medicaid Other | Admitting: Family Medicine

## 2022-02-03 ENCOUNTER — Other Ambulatory Visit: Payer: Self-pay

## 2022-02-03 ENCOUNTER — Encounter: Payer: Self-pay | Admitting: Family Medicine

## 2022-02-03 VITALS — BP 110/80 | HR 99 | Ht 60.0 in | Wt 147.6 lb

## 2022-02-03 DIAGNOSIS — M21372 Foot drop, left foot: Secondary | ICD-10-CM

## 2022-02-03 DIAGNOSIS — S060X0A Concussion without loss of consciousness, initial encounter: Secondary | ICD-10-CM

## 2022-02-03 MED ORDER — TOPIRAMATE 25 MG PO TABS: 25.0000 mg | ORAL_TABLET | Freq: Two times a day (BID) | ORAL | 1 refills | Status: DC

## 2022-02-03 MED ORDER — TRAZODONE HCL 50 MG PO TABS: 50.0000 mg | ORAL_TABLET | Freq: Every day | ORAL | 1 refills | Status: DC
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
## 2022-02-03 NOTE — Patient Instructions (Addendum)
Good to see you today.  I've referred you to Vestibular & Speech Therapy.  Let us know if you don't hear from them in one week.   I've referred you to [facility] for a nerve conduction study  Follow-up: in 1 months

## 2022-02-06 ENCOUNTER — Encounter: Payer: Self-pay | Admitting: Neurology

## 2022-02-10 ENCOUNTER — Ambulatory Visit: Payer: Self-pay | Attending: Family Medicine

## 2022-02-10 ENCOUNTER — Other Ambulatory Visit: Payer: Self-pay

## 2022-02-10 DIAGNOSIS — R41841 Cognitive communication deficit: Secondary | ICD-10-CM | POA: Insufficient documentation
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
## 2022-02-12 ENCOUNTER — Encounter: Payer: Self-pay | Admitting: Family Medicine

## 2022-02-13 MED ORDER — TOPIRAMATE 50 MG PO TABS: 50.0000 mg | ORAL_TABLET | Freq: Two times a day (BID) | ORAL | 1 refills | Status: DC

## 2022-02-26 ENCOUNTER — Ambulatory Visit: Payer: Self-pay

## 2022-02-26 ENCOUNTER — Ambulatory Visit: Payer: Self-pay | Attending: Family Medicine

## 2022-02-26 ENCOUNTER — Other Ambulatory Visit: Payer: Self-pay

## 2022-02-26 DIAGNOSIS — M542 Cervicalgia: Secondary | ICD-10-CM | POA: Diagnosis present

## 2022-02-26 DIAGNOSIS — R2689 Other abnormalities of gait and mobility: Secondary | ICD-10-CM | POA: Insufficient documentation

## 2022-02-26 DIAGNOSIS — R41841 Cognitive communication deficit: Secondary | ICD-10-CM | POA: Insufficient documentation

## 2022-02-26 DIAGNOSIS — R42 Dizziness and giddiness: Secondary | ICD-10-CM | POA: Insufficient documentation

## 2022-02-26 DIAGNOSIS — M6281 Muscle weakness (generalized): Secondary | ICD-10-CM | POA: Insufficient documentation

## 2022-02-26 DIAGNOSIS — S060X0A Concussion without loss of consciousness, initial encounter: Secondary | ICD-10-CM | POA: Insufficient documentation

## 2022-02-26 DIAGNOSIS — R293 Abnormal posture: Secondary | ICD-10-CM | POA: Insufficient documentation
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
## 2022-02-28 MED ORDER — TOPIRAMATE 50 MG PO TABS: 50.0000 mg | ORAL_TABLET | Freq: Two times a day (BID) | ORAL | 1 refills | Status: DC

## 2022-02-28 MED ORDER — TRAZODONE HCL 50 MG PO TABS: 50.0000 mg | ORAL_TABLET | Freq: Every day | ORAL | 1 refills | Status: DC

## 2022-02-28 NOTE — Addendum Note (Signed)
Addended by: Rodolph Bong on: 02/28/2022 07:23 AM ? ? Modules accepted: Orders ? ?

## 2022-03-03 ENCOUNTER — Ambulatory Visit (INDEPENDENT_AMBULATORY_CARE_PROVIDER_SITE_OTHER): Payer: Self-pay | Admitting: Family Medicine

## 2022-03-03 ENCOUNTER — Other Ambulatory Visit: Payer: Self-pay

## 2022-03-03 VITALS — BP 110/76 | HR 82 | Ht 60.0 in | Wt 145.6 lb

## 2022-03-03 DIAGNOSIS — G44321 Chronic post-traumatic headache, intractable: Secondary | ICD-10-CM

## 2022-03-03 DIAGNOSIS — M21372 Foot drop, left foot: Secondary | ICD-10-CM

## 2022-03-03 DIAGNOSIS — R42 Dizziness and giddiness: Secondary | ICD-10-CM

## 2022-03-03 DIAGNOSIS — S060X0D Concussion without loss of consciousness, subsequent encounter: Secondary | ICD-10-CM
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
## 2022-03-03 NOTE — Patient Instructions (Signed)
Thank you for coming in today.  ? ?Cut the trazodone in half to 25 mg at bedtime.  ? ?Add cognitive rehab/speech therapy and reduce Pt to 1 day per week.  ? ?Recheck in 1 month. Let me know if you still cannot wake up in the morning. We can try something else.  ? ? ?

## 2022-03-06 ENCOUNTER — Encounter: Payer: Self-pay | Admitting: Family Medicine

## 2022-03-12 ENCOUNTER — Other Ambulatory Visit: Payer: Self-pay

## 2022-03-12 ENCOUNTER — Ambulatory Visit: Payer: Self-pay | Admitting: Physical Therapy

## 2022-03-12 ENCOUNTER — Encounter: Payer: Self-pay | Admitting: Physical Therapy

## 2022-03-12 DIAGNOSIS — R2689 Other abnormalities of gait and mobility: Secondary | ICD-10-CM | POA: Diagnosis not present

## 2022-03-12 DIAGNOSIS — R293 Abnormal posture: Secondary | ICD-10-CM

## 2022-03-12 DIAGNOSIS — M6281 Muscle weakness (generalized): Secondary | ICD-10-CM

## 2022-03-12 DIAGNOSIS — R42 Dizziness and giddiness: Secondary | ICD-10-CM

## 2022-03-12 DIAGNOSIS — M542 Cervicalgia: Secondary | ICD-10-CM

## 2022-03-12 DIAGNOSIS — R202 Paresthesia of skin: Secondary | ICD-10-CM
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
## 2022-03-13 ENCOUNTER — Ambulatory Visit (INDEPENDENT_AMBULATORY_CARE_PROVIDER_SITE_OTHER): Payer: Self-pay | Admitting: Neurology

## 2022-03-13 ENCOUNTER — Ambulatory Visit: Payer: Self-pay

## 2022-03-13 DIAGNOSIS — G5702 Lesion of sciatic nerve, left lower limb: Secondary | ICD-10-CM

## 2022-03-13 DIAGNOSIS — R202 Paresthesia of skin: Secondary | ICD-10-CM
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
## 2022-03-14 ENCOUNTER — Ambulatory Visit: Payer: Medicaid Other

## 2022-03-14 DIAGNOSIS — R41841 Cognitive communication deficit: Secondary | ICD-10-CM

## 2022-03-14 DIAGNOSIS — R2689 Other abnormalities of gait and mobility: Secondary | ICD-10-CM | POA: Diagnosis not present

## 2022-03-14 NOTE — Patient Instructions (Addendum)
1) Consider bringing sunglasses and earplugs with you wherever you go (that might help with light and noise sensitivity) ? ?2) Keep your notebook/calendar on your table. Keep it in this location. ? ?-After you take your medications (morning and evening), look at the date in your notebook and see what is upcoming. ? ?-Have our husband help you write down our upcoming appointments in your notebook. It is nice to have someone to help you in case you have trouble remembering.  ? ?-Write down ONE task to do each day. Check if off after you have completed. Do this task as soon as you can so you don't forget ? ? ?Bring notebook to next Speech Therapy session   ? ? ? ? ? ?
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
## 2022-03-14 NOTE — Progress Notes (Signed)
Nerve conduction study of the left lower leg shows that the nerve near the left knee that can help your foot go up has been hurt.  We will talk about this in full detail at the next follow-up.

## 2022-03-20 ENCOUNTER — Ambulatory Visit: Payer: Self-pay | Attending: Physical Medicine and Rehabilitation

## 2022-03-20 DIAGNOSIS — R2689 Other abnormalities of gait and mobility: Secondary | ICD-10-CM | POA: Insufficient documentation

## 2022-03-20 DIAGNOSIS — R41841 Cognitive communication deficit: Secondary | ICD-10-CM | POA: Insufficient documentation

## 2022-03-20 DIAGNOSIS — R293 Abnormal posture: Secondary | ICD-10-CM | POA: Insufficient documentation

## 2022-03-20 DIAGNOSIS — M6281 Muscle weakness (generalized): Secondary | ICD-10-CM | POA: Insufficient documentation

## 2022-03-20 DIAGNOSIS — M542 Cervicalgia: Secondary | ICD-10-CM | POA: Insufficient documentation

## 2022-03-20 DIAGNOSIS — R42 Dizziness and giddiness: Secondary | ICD-10-CM | POA: Insufficient documentation

## 2022-03-20 NOTE — Patient Instructions (Signed)
Access Code: [email or web address] ?URL: [email or web address] ?Date: 03/20/2022 ?Prepared by: Elmer Bales ? ?Exercises ?- Seated gaze stabilization with 2 targets and head rotation  - 2 x daily - 5 x weekly - 1 sets - 5 reps ?
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
## 2022-03-21 ENCOUNTER — Ambulatory Visit: Payer: Self-pay | Admitting: Speech Pathology

## 2022-03-21 ENCOUNTER — Ambulatory Visit: Payer: Medicaid Other

## 2022-03-21 DIAGNOSIS — R41841 Cognitive communication deficit: Secondary | ICD-10-CM

## 2022-03-21 NOTE — Patient Instructions (Signed)
Continue trying to implement planner. Remember the planner only works if you use it.  ? ?I want you to set aside a time to look at it every morning. ? ?We talked today about checking planner while you eat breakfast.  ? ?Husband should check to make sure you've used your planner that day.  ?

## 2022-03-24 ENCOUNTER — Ambulatory Visit: Payer: Self-pay

## 2022-03-24 DIAGNOSIS — R293 Abnormal posture: Secondary | ICD-10-CM

## 2022-03-24 DIAGNOSIS — R2689 Other abnormalities of gait and mobility: Secondary | ICD-10-CM

## 2022-03-24 DIAGNOSIS — R42 Dizziness and giddiness: Secondary | ICD-10-CM

## 2022-03-24 DIAGNOSIS — M6281 Muscle weakness (generalized): Secondary | ICD-10-CM

## 2022-03-24 DIAGNOSIS — M542 Cervicalgia: Secondary | ICD-10-CM
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
## 2022-03-24 NOTE — Patient Instructions (Signed)
Neck Rotation with EYES CLOSED ? ? ? ?Begin with chin level, head centered over spine. Slowly turn head to the left, keeping head centered, shoulders and torso stationary. Hold 5 seconds. Slowly return to starting position. Repeat to other side. Make sure to complete with eyes closed. ?Repeat 3-5 times to each side. ? ?Copyright ? [employer]. All rights reserved.  ? ?

## 2022-03-25 ENCOUNTER — Ambulatory Visit: Payer: Self-pay | Admitting: Speech Pathology

## 2022-03-25 DIAGNOSIS — R41841 Cognitive communication deficit: Secondary | ICD-10-CM

## 2022-03-25 NOTE — Therapy (Signed)
[facility] ?[address] ?[place], [place], [address] ?Phone: [phone]   Fax:  [phone] ? ?Speech Language Pathology Treatment ? ?Patient Details  ?Name: Anagha Loseke Pica ?MRN: [id number] ?Date of Birth: 18-Jan-1991 ?Referring Provider (SLP): Rodolph Bong, MD ? ? ?Encounter Date: 03/21/2022 ? ? ? ?Past Medical History:  ?Diagnosis Date  ? Medical history non-contributory   ? ? ?Past Surgical History:  ?Procedure Laterality Date  ? NO PAST SURGERIES    ? ? ?There were no vitals filed for this visit. ? ? ? ? ? ? ? ? ? ADULT SLP TREATMENT - 03/25/22 0001   ? ?  ? General Information  ? Behavior/Cognition Alert;Pleasant mood;Cooperative;Lethargic   ?  ? Treatment Provided  ? Treatment provided Cognitive-Linquistic   ?  ? Cognitive-Linquistic Treatment  ? Treatment focused on Cognition;Patient/family/caregiver education   ? Skilled Treatment Pt attends session with sponsor, [name]. Reports to having significantly increased fatigue following last week's ST session in which pt had to sleep excessively throughout the day. Discussed whether this is good time for ST, pt wants to continue to "get better." Advised pt that therapy is contraindicated if it completely depletes her energy levels. Throughout entirety of session pt presents with eyes unable to stay open, difficulty focusing, reporting headache. Took pt's BP, after pt sat quietly for 9 minutes, reads 124/82. Session kept very mellow iwth ST dimming lights, using soft voice, not engaging in cognitive exercises. Discussion on continuing to use planner, creating routine of when to reference it each day. Education on mindfulness practice to increase attention capabilities. Recommend pt begins incorporating 5-10 minute informal or breathing practice daily to aid in attention and focus. Will assess pt's ongoing therapy tolerance at next session.   ?  ? Assessment / Recommendations / Plan  ? Plan Continue with current  plan of care   ?  ? Progression Toward Goals  ? Progression toward goals Progressing toward goals   ? ?  ?  ? ?  ? ? ? ? ? SLP Short Term Goals - 03/21/22 0841   ? ?  ? SLP SHORT TERM GOAL #1  ? Title Pt will complete PROM in first session to assist with further goal writing given time constraints   ? Baseline informally assessed patient/sponsor reports as pt unable to complete PROM due to concussion limitations   ? Status Achieved   ?  ? SLP SHORT TERM GOAL #2  ? Title Pt will implement 2 memory/attention compensations to aid daily functioning given occasional min A over 2 sessions   ? Time 3   ? Period Weeks   ? Status On-going   ?  ? SLP SHORT TERM GOAL #3  ? Title Pt will carryover energy conservation strategies to manage interfering symptoms impacting cognitive functioning over 2 sessions   ? Time 3   ? Period Weeks   ? Status On-going   ?  ? SLP SHORT TERM GOAL #4  ? Title Pt will utilize word finding strategies in simple 5-10 minute conversation given occasional min A over 2 sessions   ? Time 3   ? Period Weeks   ? Status On-going   ? ?  ?  ? ?  ? ? ? SLP Long Term Goals - 03/21/22 0841   ? ?  ? SLP LONG TERM GOAL #1  ? Title Pt will implement 4 memory/attention compensations to aid daily functioning given rare min A over 2 sessions   ?  Time 11   ? Period Weeks   ? Status On-going   ?  ? SLP LONG TERM GOAL #2  ? Title Pt will utilize word finding compensations in 20+ minute simple conversation given rare min A over 2 sessions   ? Time 11   ? Period Weeks   ? Status On-going   ?  ? SLP LONG TERM GOAL #3  ? Title Pt will report improved cognitive linguistic functioning via PROM by 2 points at last ST session   ? Time 11   ? Period Weeks   ? Status On-going   ? ?  ?  ? ?  ? ? ? ? ?Patient will benefit from skilled therapeutic intervention in order to improve the following deficits and impairments:   ?Cognitive communication deficit ? ? ? ?Problem List ?Patient Active Problem List  ? Diagnosis Date Noted  ?  Vaginal discharge 06/15/2018  ? ? ?Maia Breslow, CF-SLP ?03/25/2022, 8:48 AM ? ?[facility] ?[address] ?[place], [place], [address] ?Phone: [phone]   Fax:  [phone] ? ? ?Name: Zaiyah Sottile Urquiza ?MRN: [id number] ?Date of Birth: 12/22/1990 ? ?
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
## 2022-03-26 ENCOUNTER — Ambulatory Visit: Payer: Medicaid Other

## 2022-03-26 ENCOUNTER — Encounter: Payer: Self-pay | Admitting: Family Medicine

## 2022-03-26 ENCOUNTER — Ambulatory Visit (INDEPENDENT_AMBULATORY_CARE_PROVIDER_SITE_OTHER): Payer: Self-pay | Admitting: Family Medicine

## 2022-03-26 VITALS — BP 104/78 | HR 86 | Ht 60.0 in | Wt 146.0 lb

## 2022-03-26 DIAGNOSIS — S060X0D Concussion without loss of consciousness, subsequent encounter: Secondary | ICD-10-CM

## 2022-03-26 DIAGNOSIS — M21372 Foot drop, left foot: Secondary | ICD-10-CM

## 2022-03-26 DIAGNOSIS — G44321 Chronic post-traumatic headache, intractable: Secondary | ICD-10-CM

## 2022-03-26 DIAGNOSIS — R21 Rash and other nonspecific skin eruption: Secondary | ICD-10-CM

## 2022-03-26 MED ORDER — TRIAMCINOLONE ACETONIDE 0.1 % EX CREA: 1.0000 "application " | TOPICAL_CREAM | Freq: Two times a day (BID) | CUTANEOUS | 2 refills | Status: DC
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
## 2022-03-26 NOTE — Patient Instructions (Addendum)
Good to see you. ? ?STOP the topomax (2x a dam medicine for headache) because of the rash ? ?Use the Triamolone cream on the rash.  ? ?Follow-up: 1 month ?

## 2022-04-02 ENCOUNTER — Ambulatory Visit: Payer: Medicaid Other

## 2022-04-02 ENCOUNTER — Ambulatory Visit: Payer: Self-pay

## 2022-04-03 ENCOUNTER — Ambulatory Visit: Payer: Medicaid Other

## 2022-04-03 DIAGNOSIS — M6281 Muscle weakness (generalized): Secondary | ICD-10-CM

## 2022-04-03 DIAGNOSIS — M542 Cervicalgia: Secondary | ICD-10-CM

## 2022-04-03 DIAGNOSIS — R42 Dizziness and giddiness: Secondary | ICD-10-CM
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
## 2022-04-07 ENCOUNTER — Ambulatory Visit: Payer: Self-pay

## 2022-04-07 DIAGNOSIS — R2689 Other abnormalities of gait and mobility: Secondary | ICD-10-CM

## 2022-04-07 DIAGNOSIS — M6281 Muscle weakness (generalized): Secondary | ICD-10-CM

## 2022-04-07 DIAGNOSIS — R42 Dizziness and giddiness: Secondary | ICD-10-CM
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
## 2022-04-07 NOTE — Patient Instructions (Signed)
Access Code: [email or web address] ?URL: [email or web address] ?Date: 04/07/2022 ?Prepared by: Elmer Bales ? ?Exercises ?- Seated Ankle Pumps  - 4 x daily - 7 x weekly - 1 sets - 10 reps ?- Seated Long Arc Quad  - 3 x daily - 7 x weekly - 1 sets - 10 reps ?- Seated Heel Slide  - 3 x daily - 7 x weekly - 1 sets - 10 reps ?- Seated March  - 3 x daily - 7 x weekly - 1 sets - 10 reps ?- Ankle Dorsiflexion with Resistance  - 2 x daily - 5 x weekly - 2 sets - 5 reps ?- Seated gaze stabilization with 2 targets and head rotation  - 2 x daily - 5 x weekly - 2 sets - 5 reps ?

## 2022-04-09 ENCOUNTER — Ambulatory Visit: Payer: Medicaid Other | Attending: Physical Medicine and Rehabilitation

## 2022-04-09 ENCOUNTER — Ambulatory Visit: Payer: Medicaid Other

## 2022-04-09 DIAGNOSIS — R41841 Cognitive communication deficit: Secondary | ICD-10-CM | POA: Insufficient documentation
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
## 2022-04-09 NOTE — Patient Instructions (Signed)
Recommendations: ?Cooking  ?Place all the ingredients you need on the counter ?After you use the ingredient, put it back in the cabinet  ? OR you can write down the ingredients and check off as you put them in  ?Laundry ?Do it immediately  ?Set an alarm (10-15 minutes) to put clothes into the dryer  ?Planting  ?Water every day after you drop off the girls  ?You can always ask for help  ?

## 2022-04-14 ENCOUNTER — Ambulatory Visit: Payer: Medicaid Other

## 2022-04-14 ENCOUNTER — Ambulatory Visit: Payer: Medicaid Other | Attending: Physical Medicine and Rehabilitation

## 2022-04-14 DIAGNOSIS — R41841 Cognitive communication deficit: Secondary | ICD-10-CM | POA: Insufficient documentation
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
## 2022-04-14 NOTE — Patient Instructions (Addendum)
Name 10-15 items in each category: ?Lori: ? ? ? ? ? ? ? ? ?Cooking ingredients: ? ? ? ? ? ? ? ? ?Children (school, Lori Cardenas, Lori Cardenas, Lori): ? ? ? ? ? ? ? ? ? ?Therapy:  ? ? ? ? ? ? ? ? ? ? ?____________________________________________________________________________________________ ?When you have trouble saying the word you want to say: ? ?1)  Describe it! Describe the size, color, shape, function, composition (what it's made of), and/or location to be able to have the word come sooner, or to have your listener help you out ? ?2) "talk around the word" (say it a totally different way) -get your point out using different words than the one/ones you can't think of ? ?3) Use a synonym - think of another word that means the exact same thing ? ?4) DRAW! You can draw some things you want to say in order to give your listener a hint about what you're talking about ? ?5)  Gesture- make motions to help your listener understand what you are trying to communicate ? ?6) Write down the word, if you can - or the first letter or letters, to help you say the word or to give your listener a hint about what you're trying to say  ? ?

## 2022-04-16 ENCOUNTER — Ambulatory Visit: Payer: Medicaid Other

## 2022-04-24 ENCOUNTER — Ambulatory Visit: Payer: Medicaid Other

## 2022-04-24 DIAGNOSIS — R41841 Cognitive communication deficit: Secondary | ICD-10-CM
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
## 2022-04-24 NOTE — Patient Instructions (Signed)
Homework:  ?1) Write down Saturday events in your agenda  ?2) Write some notes about the motivational movie you are watching. Include the title. We will discuss this next time   ? ? ? ? ? ? ? ?Read 15-20 minutes per day.  ? -Read books that are familiar to you (ones you have read before). Favorite children's books ? -Write down the title of your favorite book (about marriage) ? ?When cooking, add salt as the first as part of routine when adding spices  ? ?

## 2022-04-30 ENCOUNTER — Ambulatory Visit: Payer: Medicaid Other

## 2022-04-30 DIAGNOSIS — R41841 Cognitive communication deficit: Secondary | ICD-10-CM | POA: Diagnosis not present

## 2022-04-30 NOTE — Patient Instructions (Addendum)
Communication strategies: ?Try taking 3-5 deep breaths if you start to feel upset  ?Say a phrase such as "I need a break" and excuse yourself to your room, sit outside, or paint   ?It sounds like you are getting frustrated when headaches are getting worse, so try to catch them before they get too intense. Let your family know what you need "  ? ?Take notes while you are reading to help you remember the details. Write down some notes about your book to discuss with me next session  ? ?Write down your headache level (on scale of 1-10) so you can monitor how much your headaches are affecting you  ? ?If you are having a bad day physically, you may want to limit your physical activity  ?
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
## 2022-05-01 ENCOUNTER — Ambulatory Visit: Payer: Medicaid Other | Admitting: Family Medicine

## 2022-05-05 ENCOUNTER — Encounter: Payer: Self-pay | Admitting: Family Medicine

## 2022-05-05 ENCOUNTER — Ambulatory Visit (INDEPENDENT_AMBULATORY_CARE_PROVIDER_SITE_OTHER): Payer: Self-pay | Admitting: Family Medicine

## 2022-05-05 VITALS — BP 110/80 | HR 76 | Ht 60.0 in | Wt 147.0 lb

## 2022-05-05 DIAGNOSIS — F32A Depression, unspecified: Secondary | ICD-10-CM

## 2022-05-05 DIAGNOSIS — F419 Anxiety disorder, unspecified: Secondary | ICD-10-CM

## 2022-05-05 DIAGNOSIS — S060X0D Concussion without loss of consciousness, subsequent encounter: Secondary | ICD-10-CM

## 2022-05-05 DIAGNOSIS — G44321 Chronic post-traumatic headache, intractable: Secondary | ICD-10-CM

## 2022-05-05 MED ORDER — FLUOXETINE HCL 10 MG PO CAPS: ORAL_CAPSULE | ORAL | 0 refills | Status: DC

## 2022-05-05 MED ORDER — TOPIRAMATE 50 MG PO TABS: 50.0000 mg | ORAL_TABLET | Freq: Two times a day (BID) | ORAL | 1 refills | Status: DC

## 2022-05-05 NOTE — Patient Instructions (Addendum)
Good to see you today.  Follow-up: 1 month.   Restart Topamax. Stop it if you get a rash again.   IN 2 weeks start fluoxetine 1 pill daily and increase to 2 pills daily after 1 week.   Hey Peers  [facility]
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
## 2022-05-07 ENCOUNTER — Encounter: Payer: Medicaid Other | Admitting: Speech Pathology

## 2022-05-07 ENCOUNTER — Ambulatory Visit: Payer: Medicaid Other

## 2022-05-08 ENCOUNTER — Ambulatory Visit: Payer: Medicaid Other

## 2022-05-08 DIAGNOSIS — R41841 Cognitive communication deficit: Secondary | ICD-10-CM

## 2022-05-08 NOTE — Patient Instructions (Addendum)
[facility] ([facility] [employer]) Apolinar Junes, PT, DPT 2nd Tuesday of each month 4-5:00 pm [email or web address] * New location: Address: [address], [place], [place] [address] (Members should park out front or on the first level of the parking garage, come to the main lobby and walk straight back. We are meeting in the large conference room across from the cafe in the center of the first level of the building.) *Contact Group leader for directions and reminders. Contact Lennart Pall at [phone]  [employer] [facility] Dates/times/locations: [email or web address] Toll-free helpline: Mobile, [place] at [facility]. [address], [place] [address] [place], [place] [address] [phone] Locate on [facility] [address] [place], [place] [address] [place] on Map
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
## 2022-05-21 ENCOUNTER — Ambulatory Visit: Payer: Medicaid Other | Attending: Physical Medicine and Rehabilitation

## 2022-05-21 DIAGNOSIS — M6281 Muscle weakness (generalized): Secondary | ICD-10-CM

## 2022-05-21 DIAGNOSIS — R2689 Other abnormalities of gait and mobility: Secondary | ICD-10-CM | POA: Diagnosis present

## 2022-05-21 DIAGNOSIS — R41841 Cognitive communication deficit: Secondary | ICD-10-CM | POA: Insufficient documentation

## 2022-05-21 DIAGNOSIS — R42 Dizziness and giddiness: Secondary | ICD-10-CM
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
## 2022-05-22 ENCOUNTER — Ambulatory Visit: Payer: Medicaid Other

## 2022-05-22 DIAGNOSIS — R41841 Cognitive communication deficit: Secondary | ICD-10-CM

## 2022-05-22 DIAGNOSIS — R2689 Other abnormalities of gait and mobility: Secondary | ICD-10-CM | POA: Diagnosis not present

## 2022-05-22 NOTE — Patient Instructions (Signed)
[place] ( BI Support Group at [employer]) Serina Cowper, PT, DPT 2nd Tuesday of each month 4-5:00 pm [email or web address] * New location: Address: [address], [place], [place] [address] (Members should park out front or on the first level of the parking garage, come to the main lobby and walk straight back. We are meeting in the large conference room across from the cafe in the center of the first level of the building.) *Contact Group leader for directions and reminders. Contact Amada Jupiter at [phone]   [facility]  Dates/times/locations: [email or web address] Toll-free helpline: [phone]
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
## 2022-05-26 ENCOUNTER — Telehealth: Payer: Self-pay

## 2022-05-26 DIAGNOSIS — R29898 Other symptoms and signs involving the musculoskeletal system: Secondary | ICD-10-CM

## 2022-05-26 DIAGNOSIS — M6281 Muscle weakness (generalized): Secondary | ICD-10-CM
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
## 2022-05-27 ENCOUNTER — Ambulatory Visit: Payer: Medicaid Other | Admitting: Physical Therapy

## 2022-05-27 NOTE — Telephone Encounter (Signed)
Thank you :)

## 2022-05-27 NOTE — Telephone Encounter (Signed)
OT placed

## 2022-05-28 ENCOUNTER — Ambulatory Visit: Payer: Medicaid Other
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
## 2022-06-02 ENCOUNTER — Ambulatory Visit (INDEPENDENT_AMBULATORY_CARE_PROVIDER_SITE_OTHER): Payer: Self-pay | Admitting: Family Medicine

## 2022-06-02 ENCOUNTER — Encounter: Payer: Self-pay | Admitting: Family Medicine

## 2022-06-02 VITALS — BP 120/80 | HR 61 | Ht 60.0 in | Wt 145.6 lb

## 2022-06-02 DIAGNOSIS — G4759 Other parasomnia: Secondary | ICD-10-CM

## 2022-06-02 DIAGNOSIS — S060X0D Concussion without loss of consciousness, subsequent encounter: Secondary | ICD-10-CM

## 2022-06-02 MED ORDER — AMPHETAMINE-DEXTROAMPHET ER 20 MG PO CP24: 20.0000 mg | ORAL_CAPSULE | Freq: Every day | ORAL | 0 refills | Status: DC

## 2022-06-02 NOTE — Patient Instructions (Addendum)
Good to see you today.  Take Adderall in the morning and take melatonin at night.  I've referred you to neurology.  Their office will call you to schedule but please let us know if you don't hear from them in one week regarding scheduling.  Follow-up: one month (30 min)

## 2022-06-03 ENCOUNTER — Encounter: Payer: Self-pay | Admitting: Physical Therapy

## 2022-06-03 ENCOUNTER — Ambulatory Visit: Payer: Medicaid Other | Admitting: Physical Therapy

## 2022-06-03 DIAGNOSIS — R2689 Other abnormalities of gait and mobility: Secondary | ICD-10-CM

## 2022-06-03 DIAGNOSIS — R42 Dizziness and giddiness: Secondary | ICD-10-CM

## 2022-06-03 DIAGNOSIS — M6281 Muscle weakness (generalized): Secondary | ICD-10-CM
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
## 2022-06-06 ENCOUNTER — Encounter: Payer: Medicaid Other | Admitting: Speech Pathology

## 2022-06-06 ENCOUNTER — Ambulatory Visit: Payer: Medicaid Other

## 2022-06-10 ENCOUNTER — Ambulatory Visit: Payer: Medicaid Other | Admitting: Physical Therapy

## 2022-06-10 ENCOUNTER — Encounter: Payer: Self-pay | Admitting: Physical Therapy

## 2022-06-10 DIAGNOSIS — R2689 Other abnormalities of gait and mobility: Secondary | ICD-10-CM

## 2022-06-10 DIAGNOSIS — M6281 Muscle weakness (generalized): Secondary | ICD-10-CM

## 2022-06-10 DIAGNOSIS — R42 Dizziness and giddiness: Secondary | ICD-10-CM

## 2022-06-13 ENCOUNTER — Ambulatory Visit: Payer: Medicaid Other

## 2022-06-19 ENCOUNTER — Ambulatory Visit: Payer: Medicaid Other

## 2022-06-20 ENCOUNTER — Ambulatory Visit: Payer: Medicaid Other

## 2022-06-20 ENCOUNTER — Ambulatory Visit: Payer: Medicaid Other | Attending: Physical Medicine and Rehabilitation

## 2022-06-20 DIAGNOSIS — R41841 Cognitive communication deficit: Secondary | ICD-10-CM | POA: Diagnosis present

## 2022-06-20 DIAGNOSIS — R293 Abnormal posture: Secondary | ICD-10-CM | POA: Diagnosis present

## 2022-06-20 DIAGNOSIS — M6281 Muscle weakness (generalized): Secondary | ICD-10-CM | POA: Diagnosis present

## 2022-06-20 DIAGNOSIS — R42 Dizziness and giddiness: Secondary | ICD-10-CM | POA: Insufficient documentation

## 2022-06-20 DIAGNOSIS — R2689 Other abnormalities of gait and mobility: Secondary | ICD-10-CM | POA: Insufficient documentation
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
## 2022-06-26 ENCOUNTER — Ambulatory Visit: Payer: Medicaid Other | Admitting: Physical Therapy

## 2022-06-27 ENCOUNTER — Encounter: Payer: Self-pay | Admitting: Physical Therapy

## 2022-06-27 ENCOUNTER — Ambulatory Visit: Payer: Medicaid Other | Admitting: Physical Therapy

## 2022-06-27 DIAGNOSIS — R41841 Cognitive communication deficit: Secondary | ICD-10-CM | POA: Diagnosis not present

## 2022-06-27 DIAGNOSIS — R2689 Other abnormalities of gait and mobility: Secondary | ICD-10-CM

## 2022-06-27 DIAGNOSIS — M6281 Muscle weakness (generalized): Secondary | ICD-10-CM

## 2022-06-27 DIAGNOSIS — R42 Dizziness and giddiness: Secondary | ICD-10-CM
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
## 2022-07-08 ENCOUNTER — Encounter: Payer: Self-pay | Admitting: Physical Therapy

## 2022-07-08 ENCOUNTER — Ambulatory Visit: Payer: Medicaid Other | Admitting: Physical Therapy

## 2022-07-08 DIAGNOSIS — R293 Abnormal posture: Secondary | ICD-10-CM

## 2022-07-08 DIAGNOSIS — R2689 Other abnormalities of gait and mobility: Secondary | ICD-10-CM

## 2022-07-08 DIAGNOSIS — R41841 Cognitive communication deficit: Secondary | ICD-10-CM | POA: Diagnosis not present

## 2022-07-08 DIAGNOSIS — M6281 Muscle weakness (generalized): Secondary | ICD-10-CM

## 2022-07-08 DIAGNOSIS — R42 Dizziness and giddiness: Secondary | ICD-10-CM

## 2022-07-08 NOTE — Patient Instructions (Addendum)
Turning in Place: Solid Surface    Standing in place, lead with head and turn slowly making quarter turns toward left. Wait for dizziness to go away and then turn other direction. Perform to both right and left.  Repeat ___5_ times per session. Do __1__ sessions per day. Copyright  VHI. All rights reserved.    Gaze Stabilization: Sitting    Keeping eyes on target on wall a couple feet feet away, tilt head down 15-30 and move head side to side for __30__ seconds. Repeat while moving head up and down for __30__ seconds. Perform 2-3 reps of each.  Do ___1_ sessions per day.   Copyright  VHI. All rights reserved.
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
## 2022-07-09 ENCOUNTER — Ambulatory Visit: Payer: Medicaid Other | Admitting: Family Medicine

## 2022-07-09 NOTE — Addendum Note (Signed)
Addended by: Drake Leach on: 07/09/2022 08:35 AM   Modules accepted: Orders

## 2022-07-15 ENCOUNTER — Institutional Professional Consult (permissible substitution): Payer: Self-pay | Admitting: Neurology

## 2022-07-17 ENCOUNTER — Encounter: Payer: Self-pay | Admitting: Family Medicine

## 2022-07-18 MED ORDER — TOPIRAMATE 50 MG PO TABS: 50.0000 mg | ORAL_TABLET | Freq: Two times a day (BID) | ORAL | 5 refills | Status: AC

## 2022-07-21 ENCOUNTER — Encounter: Payer: Self-pay | Admitting: Neurology

## 2022-07-21 ENCOUNTER — Ambulatory Visit (INDEPENDENT_AMBULATORY_CARE_PROVIDER_SITE_OTHER): Payer: Self-pay | Admitting: Neurology

## 2022-07-21 VITALS — BP 112/78 | HR 76 | Ht 61.0 in

## 2022-07-21 DIAGNOSIS — F5104 Psychophysiologic insomnia: Secondary | ICD-10-CM

## 2022-07-21 DIAGNOSIS — G4759 Other parasomnia: Secondary | ICD-10-CM

## 2022-07-21 DIAGNOSIS — R41843 Psychomotor deficit: Secondary | ICD-10-CM | POA: Insufficient documentation

## 2022-07-21 MED ORDER — TRAZODONE HCL 50 MG PO TABS: 25.0000 mg | ORAL_TABLET | Freq: Every day | ORAL | 1 refills | Status: AC
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
## 2022-07-28 ENCOUNTER — Telehealth: Payer: Self-pay | Admitting: Neurology

## 2022-07-28 NOTE — Telephone Encounter (Signed)
lvm for pt to call to schedule.

## 2022-07-29 ENCOUNTER — Ambulatory Visit (INDEPENDENT_AMBULATORY_CARE_PROVIDER_SITE_OTHER): Payer: Medicaid Other | Admitting: Neurology

## 2022-07-29 DIAGNOSIS — F5104 Psychophysiologic insomnia: Secondary | ICD-10-CM

## 2022-07-29 DIAGNOSIS — R4182 Altered mental status, unspecified: Secondary | ICD-10-CM

## 2022-07-29 DIAGNOSIS — R41843 Psychomotor deficit: Secondary | ICD-10-CM

## 2022-07-29 DIAGNOSIS — G4759 Other parasomnia: Secondary | ICD-10-CM

## 2022-07-31 NOTE — Telephone Encounter (Signed)
LVM for pt to call back to schedule.

## 2022-08-05 NOTE — Procedures (Signed)
    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 As the record progresses, the patient appears to remain in the waking state throughout the recording. Photic stimulation was performed, did not show any abnormalities. Hyperventilation was also performed, did not show any abnormalities. Toward the end of the recording, the patient enters the drowsy state with slight symmetric slowing seen. The patient never enters stage II sleep. No abnormal epileptiform discharges seen during this recording. There was no focal slowing. EKG monitor shows no evidence of cardiac rhythm abnormalities with a heart rate of 72.  Abnormality: None   Impression: This is a normal EEG recording in the waking and drowsy state. No evidence of interictal epileptiform discharges seen. A normal EEG does not exclude a diagnosis of epilepsy.    Windell Norfolk, MD [facility]

## 2022-08-06 ENCOUNTER — Telehealth: Payer: Self-pay | Admitting: Neurology

## 2022-08-06 ENCOUNTER — Encounter: Payer: Self-pay | Admitting: Neurology

## 2022-08-06 NOTE — Telephone Encounter (Signed)
Sent the patient a mychart message reviewing EEG results

## 2022-08-06 NOTE — Progress Notes (Signed)
Normal EEG -This is a normal EEG recording in the waking and drowsy state. No evidence of interictal epileptiform discharges seen. A normal EEG does not exclude a diagnosis of epilepsy.

## 2022-08-20 ENCOUNTER — Ambulatory Visit (INDEPENDENT_AMBULATORY_CARE_PROVIDER_SITE_OTHER): Payer: Medicaid Other | Admitting: Family Medicine

## 2022-08-20 VITALS — BP 118/78 | HR 81 | Ht 61.0 in | Wt 144.8 lb

## 2022-08-20 DIAGNOSIS — R41843 Psychomotor deficit: Secondary | ICD-10-CM | POA: Diagnosis not present

## 2022-08-20 DIAGNOSIS — F0781 Postconcussional syndrome: Secondary | ICD-10-CM

## 2022-08-20 DIAGNOSIS — M21372 Foot drop, left foot: Secondary | ICD-10-CM | POA: Diagnosis not present

## 2022-08-20 DIAGNOSIS — G4759 Other parasomnia: Secondary | ICD-10-CM

## 2022-08-20 MED ORDER — AMBULATORY NON FORMULARY MEDICATION: 0 refills | Status: DC
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
## 2022-08-20 NOTE — Patient Instructions (Signed)
Thank you for coming in today.   Expect a call from [employer] [facility] about the brace.  Let me know if they do not call.   I will try to help with the sleep study.   Recheck in 2 months.

## 2022-08-21 DIAGNOSIS — F0781 Postconcussional syndrome: Secondary | ICD-10-CM | POA: Insufficient documentation

## 2022-09-01 ENCOUNTER — Telehealth: Payer: Self-pay | Admitting: Neurology

## 2022-09-01 NOTE — Telephone Encounter (Signed)
Sent mychart message

## 2022-09-08 NOTE — Telephone Encounter (Signed)
NPSG- [employer] Josem Kaufmann: [id number] (exp. 09/04/22 to 12/03/22).  Patient is scheduled at [facility] for 10/09/22 at 9 pm.  Mailed packet & sent mychart message to the patient.

## 2022-10-09 ENCOUNTER — Ambulatory Visit: Payer: Medicaid Other | Admitting: Neurology

## 2022-10-09 DIAGNOSIS — R41843 Psychomotor deficit: Secondary | ICD-10-CM

## 2022-10-09 DIAGNOSIS — G4759 Other parasomnia: Secondary | ICD-10-CM | POA: Diagnosis not present

## 2022-10-09 DIAGNOSIS — F5104 Psychophysiologic insomnia: Secondary | ICD-10-CM

## 2022-10-09 DIAGNOSIS — F0781 Postconcussional syndrome: Secondary | ICD-10-CM
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
## 2022-10-17 NOTE — Progress Notes (Signed)
No physiological abnormality of sleep was captured. The patient slept with an age appropriate sleep architecture and without OSA, CSA, PLMs, seizures or irregular heart beats. No parasomnia was seen, no REM sleep enactment.  Normal sleep study - the described behaviors of sleep can be psychologically based.  I thank Dr Georgina Snell and Dr Posey Pronto for the opportunity to provide a sleep study but no follow up with sleep clinic is needed. Please inform patient of "negative sleep test result"

## 2022-10-20 ENCOUNTER — Ambulatory Visit (INDEPENDENT_AMBULATORY_CARE_PROVIDER_SITE_OTHER): Payer: Medicaid Other | Admitting: Family Medicine

## 2022-10-20 ENCOUNTER — Encounter: Payer: Self-pay | Admitting: Neurology

## 2022-10-20 VITALS — BP 110/76 | HR 71 | Ht 61.0 in | Wt 141.0 lb

## 2022-10-20 DIAGNOSIS — G44301 Post-traumatic headache, unspecified, intractable: Secondary | ICD-10-CM

## 2022-10-20 DIAGNOSIS — M21372 Foot drop, left foot: Secondary | ICD-10-CM

## 2022-10-20 DIAGNOSIS — F0781 Postconcussional syndrome: Secondary | ICD-10-CM | POA: Diagnosis not present
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
## 2022-10-20 NOTE — Patient Instructions (Addendum)
Thank you for coming in today.   Recheck in 3 months.   Try to get the AFO brace at the [facility] clinic.   Ok to try driving.

## 2022-11-19 ENCOUNTER — Encounter: Payer: Self-pay | Admitting: Family Medicine

## 2022-11-19 DIAGNOSIS — M21372 Foot drop, left foot: Secondary | ICD-10-CM

## 2022-11-20 MED ORDER — AMBULATORY NON FORMULARY MEDICATION: 0 refills | Status: AC

## 2023-01-19 ENCOUNTER — Ambulatory Visit (INDEPENDENT_AMBULATORY_CARE_PROVIDER_SITE_OTHER): Payer: Self-pay | Admitting: Family Medicine

## 2023-01-19 VITALS — BP 126/84 | HR 83 | Ht 61.0 in | Wt 145.0 lb

## 2023-01-19 DIAGNOSIS — R202 Paresthesia of skin: Secondary | ICD-10-CM

## 2023-01-19 DIAGNOSIS — G8929 Other chronic pain: Secondary | ICD-10-CM

## 2023-01-19 DIAGNOSIS — M545 Other chronic pain: Secondary | ICD-10-CM

## 2023-01-19 DIAGNOSIS — F0781 Postconcussional syndrome: Secondary | ICD-10-CM

## 2023-01-19 DIAGNOSIS — M21372 Foot drop, left foot: Secondary | ICD-10-CM

## 2023-01-19 MED ORDER — AMBULATORY NON FORMULARY MEDICATION: 0 refills | Status: AC

## 2023-01-19 NOTE — Patient Instructions (Addendum)
Thank you for coming in today.   Plan for carpal tunnel wrist brace left hand.   We will send a new order for the leg brace.   Plan for a nerve conduction study left arm  Plan for physical therapy.   Call [facility] on [address] to schedule.   Recheck with me in 6 weeks.   Make sure you get set up with Medicaid again if you are not.
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
## 2023-02-17 ENCOUNTER — Ambulatory Visit: Payer: Medicaid Other | Admitting: Internal Medicine

## 2023-02-17 ENCOUNTER — Encounter: Payer: Self-pay | Admitting: Internal Medicine

## 2023-02-17 VITALS — BP 120/82 | HR 68 | Resp 16 | Ht 61.0 in | Wt 149.0 lb

## 2023-02-17 DIAGNOSIS — S060X0S Concussion without loss of consciousness, sequela: Secondary | ICD-10-CM

## 2023-02-17 DIAGNOSIS — M79605 Pain in left leg: Secondary | ICD-10-CM

## 2023-02-17 DIAGNOSIS — F0781 Postconcussional syndrome: Secondary | ICD-10-CM | POA: Diagnosis not present

## 2023-02-17 DIAGNOSIS — M21372 Foot drop, left foot: Secondary | ICD-10-CM | POA: Diagnosis not present

## 2023-02-17 MED ORDER — CLOBETASOL PROPIONATE 0.05 % EX CREA: TOPICAL_CREAM | CUTANEOUS | 1 refills | Status: AC

## 2023-02-17 NOTE — Progress Notes (Unsigned)
Subjective:    Patient ID: Lori Cardenas, female   DOB: Dec 07, 1991, 32 y.o.   MRN: [id number]   HPI   Itchy patch on left lateral forearm and dorsal right hand.  Has had on back of neck as well.  2.  Postconcussion syndrome and left foot drop:  she is following with Dr. Georgina Snell at [facility] as well as Dr Brett Fairy, neurology.  She is having fewer headaches.Still with dizziness when tries to concentrate --this is what is limiting her trust in her driving capabilities.   She is to get an AFO for her continued left foot drop--she was measured at [facility] and should be able to pick up mid month.   Sleep study and EEG normal as well as recent MR of brain and Cspine, also normal.  This was done for left hand parasthesias.  She describes spotty areas of numbness and tingling of left hand.   Pain on left side--arm/trunk/leg is somewhat improved.  Awakens with it every morning.  Current Meds  Medication Sig   AMBULATORY NON FORMULARY MEDICATION AFO left leg fot foot drop.  Disp 1 Fax to [place] [facility] Dispense 1 Dx code: [id number] Use as needed   ibuprofen (ADVIL) 800 MG tablet Take 800 mg by mouth every 8 (eight) hours as needed for moderate pain. 1 tab 3 times daily for 5 days   levonorgestrel (MIRENA) 20 MCG/24HR IUD 1 each by Intrauterine route once.   meclizine (ANTIVERT) 25 MG tablet Take 25 mg by mouth as needed for dizziness.   topiramate (TOPAMAX) 50 MG tablet Take 1 tablet (50 mg total) by mouth 2 (two) times daily.   traZODone (DESYREL) 50 MG tablet Take 0.5-1 tablets (25-50 mg total) by mouth at bedtime.   triamcinolone cream (KENALOG) 0.1 % Apply 1 application. topically 2 (two) times daily.   No Known Allergies   Review of Systems    Objective:   BP 120/82 (BP Location: Left Arm, Patient Position: Sitting, Cuff Size: Normal)   Pulse 68   Resp 16   Ht '5\' 1"'$  (1.549 m)   Wt 149 lb (67.6 kg)    BMI 28.15 kg/m   Physical Exam Appears less dazed than previously HEENT:  PERRL, EOMI, Neck:  Supple, No adenopathy Chest:  CTA CV:   RRR without murmur or rub.  Radial and DP pulses normal and equal. Abd:  S, NT No HSM or mass. Neuro:  Again, appears slightly dazed, but ) x 3, CN II-XII grossly intact.  Motor 5/5 throughout right side.  4/5 on right with all strength testing of extremities.  More obvious is left foot drop.  DTRs 2+/4 throughout.   Decreased sensation of left hand and fingers--no definite distribution.   Right forearm with 4 cm craquel patch with some scabbing.  Also with some redness and flaking over dorsal right hand at MCP level.    Assessment & Plan   Left side pain/left low back pain with radiculopathy-as well as numbness and tingling in left hand:  call into Dr. Brett Fairy to discuss whether would be fine to start gabapentin.  Not sure if could cause more of a problem with her .    2.  Eczema:  Clobetasol cream o05% twice daily to affected areas.  Eucerin Cream for eczema relief all over after showering as well.  Addendum:  spoke with Myriam Jacobson, Dr. Edwena Felty assistant:  okay if  we initiate low dose gabapentin--will send in to start 100 mg in the evening and titrate up as needed.   Called and discussed with patient.  She is to call if has any problems with medication.

## 2023-02-18 MED ORDER — GABAPENTIN 100 MG PO CAPS: ORAL_CAPSULE | ORAL | 11 refills | Status: AC

## 2023-03-16 ENCOUNTER — Ambulatory Visit (INDEPENDENT_AMBULATORY_CARE_PROVIDER_SITE_OTHER): Payer: Medicaid Other | Admitting: Family Medicine

## 2023-03-16 VITALS — BP 108/80 | HR 86 | Ht 61.0 in | Wt 143.0 lb

## 2023-03-16 DIAGNOSIS — F0781 Postconcussional syndrome: Secondary | ICD-10-CM | POA: Diagnosis not present

## 2023-03-16 DIAGNOSIS — M21372 Foot drop, left foot: Secondary | ICD-10-CM

## 2023-03-16 DIAGNOSIS — R202 Paresthesia of skin: Secondary | ICD-10-CM | POA: Diagnosis not present
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
## 2023-03-16 NOTE — Patient Instructions (Addendum)
Thank you for coming in today.   [facility] call to schedule the nerve study.  Phone: [phone]  I will call you when I get the results and we can talk about what to do.   Let me know if you want to do physical therapy for your back.   Keep me updated.

## 2023-03-30 ENCOUNTER — Other Ambulatory Visit: Payer: Self-pay

## 2023-03-30 MED ORDER — TRIAMCINOLONE ACETONIDE 0.1 % EX CREA: 1.0000 | TOPICAL_CREAM | Freq: Two times a day (BID) | CUTANEOUS | 2 refills | Status: AC

## 2023-03-30 NOTE — Progress Notes (Signed)
Last OV 03/16/23 Next OV: not scheduled  Last refill: 03/26/22 #453.6 g / 2

## 2023-04-07 ENCOUNTER — Other Ambulatory Visit: Payer: Self-pay

## 2023-04-07 NOTE — Telephone Encounter (Signed)
Refilled 03/30/23

## 2023-04-07 NOTE — Telephone Encounter (Signed)
Refilled 03/30/23 

## 2024-02-11 ENCOUNTER — Ambulatory Visit: Payer: Medicaid Other | Admitting: Internal Medicine

## 2024-06-16 ENCOUNTER — Ambulatory Visit: Payer: Medicaid Other | Admitting: Internal Medicine
